# Patient Record
Sex: Male | Born: 1950 | Race: White | Hispanic: No | Marital: Married
Health system: Southern US, Community
[De-identification: ages and names within clinical notes are randomized; demographics above are authoritative.]

---

## 2004-11-28 ENCOUNTER — Ambulatory Visit: Payer: Self-pay | Admitting: General Surgery

## 2011-06-29 ENCOUNTER — Ambulatory Visit: Payer: Self-pay | Admitting: General Surgery

## 2011-08-29 ENCOUNTER — Ambulatory Visit: Payer: Self-pay | Admitting: Family Medicine

## 2011-11-14 ENCOUNTER — Ambulatory Visit: Payer: Self-pay | Admitting: Family Medicine

## 2011-11-15 ENCOUNTER — Ambulatory Visit: Payer: Self-pay | Admitting: Family Medicine

## 2011-11-20 ENCOUNTER — Ambulatory Visit: Payer: Self-pay | Admitting: Family Medicine

## 2011-11-26 ENCOUNTER — Ambulatory Visit: Payer: Self-pay | Admitting: Oncology

## 2011-11-26 LAB — CBC CANCER CENTER
Basophil %: 0.3 %
Eosinophil #: 0 x10 3/mm (ref 0.0–0.7)
Eosinophil %: 0.3 %
HCT: 44.5 % (ref 40.0–52.0)
HGB: 15 g/dL (ref 13.0–18.0)
Lymphocyte #: 1 x10 3/mm (ref 1.0–3.6)
MCH: 30 pg (ref 26.0–34.0)
MCHC: 33.7 g/dL (ref 32.0–36.0)
MCV: 89 fL (ref 80–100)
Monocyte %: 13.5 %
Neutrophil %: 70.2 %
Platelet: 192 x10 3/mm (ref 150–440)
RBC: 4.99 10*6/uL (ref 4.40–5.90)
RDW: 12.7 % (ref 11.5–14.5)
WBC: 6.6 x10 3/mm (ref 3.8–10.6)

## 2011-11-26 LAB — COMPREHENSIVE METABOLIC PANEL
Albumin: 4.3 g/dL (ref 3.4–5.0)
Alkaline Phosphatase: 89 U/L (ref 50–136)
BUN: 11 mg/dL (ref 7–18)
Bilirubin,Total: 0.6 mg/dL (ref 0.2–1.0)
Chloride: 97 mmol/L — ABNORMAL LOW (ref 98–107)
Co2: 27 mmol/L (ref 21–32)
EGFR (African American): >60
EGFR (Non-African Amer.): >60
Glucose: 112 mg/dL — ABNORMAL HIGH (ref 65–99)
Osmolality: 267 (ref 275–301)
Potassium: 4.2 mmol/L (ref 3.5–5.1)
SGOT(AST): 45 U/L — ABNORMAL HIGH (ref 15–37)
Sodium: 133 mmol/L — ABNORMAL LOW (ref 136–145)

## 2011-11-27 LAB — CEA: CEA: 1.9 ng/mL (ref 0.0–4.7)

## 2011-11-27 LAB — CANCER ANTIGEN 19-9: CA 19-9: 792 U/mL — ABNORMAL HIGH (ref 0–35)

## 2011-12-03 ENCOUNTER — Ambulatory Visit: Payer: Self-pay | Admitting: Oncology

## 2011-12-03 LAB — PROTIME-INR
INR: 0.9
Prothrombin Time: 12.7 secs (ref 11.5–14.7)

## 2011-12-03 LAB — APTT: Activated PTT: 27.7 secs (ref 23.6–35.9)

## 2011-12-07 LAB — PATHOLOGY REPORT

## 2011-12-10 ENCOUNTER — Ambulatory Visit: Payer: Self-pay | Admitting: Oncology

## 2011-12-10 LAB — COMPREHENSIVE METABOLIC PANEL
Albumin: 3.8 g/dL (ref 3.4–5.0)
Alkaline Phosphatase: 100 U/L (ref 50–136)
Anion Gap: 9 (ref 7–16)
BUN: 10 mg/dL (ref 7–18)
Calcium, Total: 8.8 mg/dL (ref 8.5–10.1)
Creatinine: 0.83 mg/dL (ref 0.60–1.30)
Glucose: 103 mg/dL — ABNORMAL HIGH (ref 65–99)
Osmolality: 266 (ref 275–301)
Potassium: 3.9 mmol/L (ref 3.5–5.1)
SGOT(AST): 42 U/L — ABNORMAL HIGH (ref 15–37)
Sodium: 133 mmol/L — ABNORMAL LOW (ref 136–145)
Total Protein: 7.1 g/dL (ref 6.4–8.2)

## 2011-12-10 LAB — CBC CANCER CENTER
Basophil #: 0 x10 3/mm (ref 0.0–0.1)
HCT: 39.6 % — ABNORMAL LOW (ref 40.0–52.0)
Lymphocyte #: 0.8 x10 3/mm — ABNORMAL LOW (ref 1.0–3.6)
Lymphocyte %: 16.1 %
MCHC: 34.5 g/dL (ref 32.0–36.0)
MCV: 89 fL (ref 80–100)
Monocyte %: 16.5 %
Platelet: 188 x10 3/mm (ref 150–440)
RBC: 4.48 10*6/uL (ref 4.40–5.90)
RDW: 12.2 % (ref 11.5–14.5)
WBC: 5.3 x10 3/mm (ref 3.8–10.6)

## 2011-12-14 ENCOUNTER — Emergency Department: Payer: Self-pay | Admitting: *Deleted

## 2011-12-14 LAB — CBC
HCT: 37 % — ABNORMAL LOW (ref 40.0–52.0)
HGB: 12.7 g/dL — ABNORMAL LOW (ref 13.0–18.0)
MCH: 30.6 pg (ref 26.0–34.0)
MCHC: 34.3 g/dL (ref 32.0–36.0)
MCV: 89 fL (ref 80–100)
Platelet: 184 10*3/uL (ref 150–440)
RBC: 4.14 10*6/uL — ABNORMAL LOW (ref 4.40–5.90)
RDW: 12 % (ref 11.5–14.5)
WBC: 4.4 10*3/uL (ref 3.8–10.6)

## 2011-12-14 LAB — COMPREHENSIVE METABOLIC PANEL
Albumin: 3.4 g/dL (ref 3.4–5.0)
Alkaline Phosphatase: 98 U/L (ref 50–136)
Anion Gap: 11 (ref 7–16)
BUN: 12 mg/dL (ref 7–18)
Bilirubin,Total: 0.5 mg/dL (ref 0.2–1.0)
Calcium, Total: 8.4 mg/dL — ABNORMAL LOW (ref 8.5–10.1)
Chloride: 99 mmol/L (ref 98–107)
Co2: 22 mmol/L (ref 21–32)
Creatinine: 0.74 mg/dL (ref 0.60–1.30)
EGFR (African American): >60
EGFR (Non-African Amer.): >60
Glucose: 153 mg/dL — ABNORMAL HIGH (ref 65–99)
Osmolality: 267 (ref 275–301)
Potassium: 3.7 mmol/L (ref 3.5–5.1)
SGOT(AST): 55 U/L — ABNORMAL HIGH (ref 15–37)
SGPT (ALT): 56 U/L
Sodium: 132 mmol/L — ABNORMAL LOW (ref 136–145)
Total Protein: 6.9 g/dL (ref 6.4–8.2)

## 2011-12-17 LAB — CBC CANCER CENTER
Basophil #: 0 x10 3/mm (ref 0.0–0.1)
Basophil %: 0.4 %
Eosinophil #: 0.1 x10 3/mm (ref 0.0–0.7)
Eosinophil %: 2 %
HCT: 37.1 % — ABNORMAL LOW (ref 40.0–52.0)
HGB: 12.7 g/dL — ABNORMAL LOW (ref 13.0–18.0)
Lymphocyte #: 0.6 x10 3/mm — ABNORMAL LOW (ref 1.0–3.6)
Lymphocyte %: 18.8 %
MCH: 30.3 pg (ref 26.0–34.0)
MCV: 89 fL (ref 80–100)
Monocyte #: 0.4 x10 3/mm (ref 0.2–1.0)
Neutrophil #: 2 x10 3/mm (ref 1.4–6.5)
RBC: 4.18 10*6/uL — ABNORMAL LOW (ref 4.40–5.90)
RDW: 12.5 % (ref 11.5–14.5)
WBC: 3 x10 3/mm — ABNORMAL LOW (ref 3.8–10.6)

## 2011-12-17 LAB — COMPREHENSIVE METABOLIC PANEL
Albumin: 3.6 g/dL (ref 3.4–5.0)
BUN: 15 mg/dL (ref 7–18)
Bilirubin,Total: 0.6 mg/dL (ref 0.2–1.0)
Chloride: 99 mmol/L (ref 98–107)
Co2: 27 mmol/L (ref 21–32)
Creatinine: 0.87 mg/dL (ref 0.60–1.30)
EGFR (Non-African Amer.): >60
SGPT (ALT): 118 U/L — ABNORMAL HIGH
Total Protein: 7.2 g/dL (ref 6.4–8.2)

## 2011-12-24 LAB — COMPREHENSIVE METABOLIC PANEL
Anion Gap: 11 (ref 7–16)
BUN: 10 mg/dL (ref 7–18)
Bilirubin,Total: 0.3 mg/dL (ref 0.2–1.0)
Chloride: 97 mmol/L — ABNORMAL LOW (ref 98–107)
Co2: 26 mmol/L (ref 21–32)
Creatinine: 0.81 mg/dL (ref 0.60–1.30)
EGFR (African American): >60
EGFR (Non-African Amer.): >60
Osmolality: 268 (ref 275–301)
Potassium: 4.2 mmol/L (ref 3.5–5.1)
SGOT(AST): 48 U/L — ABNORMAL HIGH (ref 15–37)
Total Protein: 6.9 g/dL (ref 6.4–8.2)

## 2011-12-24 LAB — CBC CANCER CENTER
Basophil %: 0.2 %
HCT: 37.2 % — ABNORMAL LOW (ref 40.0–52.0)
HGB: 12.6 g/dL — ABNORMAL LOW (ref 13.0–18.0)
Lymphocyte %: 23.3 %
Monocyte #: 0.5 x10 3/mm (ref 0.2–1.0)
Monocyte %: 16 %
Neutrophil #: 2.1 x10 3/mm (ref 1.4–6.5)
WBC: 3.4 x10 3/mm — ABNORMAL LOW (ref 3.8–10.6)

## 2012-01-07 LAB — COMPREHENSIVE METABOLIC PANEL
Albumin: 3.6 g/dL (ref 3.4–5.0)
Alkaline Phosphatase: 81 U/L (ref 50–136)
Anion Gap: 12 (ref 7–16)
Bilirubin,Total: 0.3 mg/dL (ref 0.2–1.0)
Calcium, Total: 8.7 mg/dL (ref 8.5–10.1)
EGFR (Non-African Amer.): >60
SGOT(AST): 33 U/L (ref 15–37)
Total Protein: 6.7 g/dL (ref 6.4–8.2)

## 2012-01-07 LAB — CBC CANCER CENTER
Basophil #: 0.1 x10 3/mm (ref 0.0–0.1)
Basophil %: 1.1 %
Eosinophil #: 0 x10 3/mm (ref 0.0–0.7)
HCT: 37.6 % — ABNORMAL LOW (ref 40.0–52.0)
HGB: 13 g/dL (ref 13.0–18.0)
Lymphocyte #: 1 x10 3/mm (ref 1.0–3.6)
MCH: 31.2 pg (ref 26.0–34.0)
MCHC: 34.7 g/dL (ref 32.0–36.0)
MCV: 90 fL (ref 80–100)
Monocyte #: 0.9 x10 3/mm (ref 0.2–1.0)
Neutrophil #: 2.6 x10 3/mm (ref 1.4–6.5)
RDW: 13.6 % (ref 11.5–14.5)

## 2012-01-10 ENCOUNTER — Ambulatory Visit: Payer: Self-pay | Admitting: Oncology

## 2012-01-14 LAB — CBC CANCER CENTER
Basophil #: 0 x10 3/mm (ref 0.0–0.1)
Eosinophil %: 0.9 %
HGB: 12.5 g/dL — ABNORMAL LOW (ref 13.0–18.0)
Lymphocyte #: 0.8 x10 3/mm — ABNORMAL LOW (ref 1.0–3.6)
Lymphocyte %: 24.8 %
MCV: 91 fL (ref 80–100)
Monocyte %: 13 %
Platelet: 228 x10 3/mm (ref 150–440)
RBC: 4.03 10*6/uL — ABNORMAL LOW (ref 4.40–5.90)
RDW: 13.3 % (ref 11.5–14.5)
WBC: 3.4 x10 3/mm — ABNORMAL LOW (ref 3.8–10.6)

## 2012-01-14 LAB — COMPREHENSIVE METABOLIC PANEL
Albumin: 3.4 g/dL (ref 3.4–5.0)
Anion Gap: 10 (ref 7–16)
BUN: 16 mg/dL (ref 7–18)
Bilirubin,Total: 0.2 mg/dL (ref 0.2–1.0)
Chloride: 98 mmol/L (ref 98–107)
Co2: 26 mmol/L (ref 21–32)
Creatinine: 0.94 mg/dL (ref 0.60–1.30)
EGFR (African American): >60
Glucose: 136 mg/dL — ABNORMAL HIGH (ref 65–99)
Osmolality: 272 (ref 275–301)
Potassium: 4 mmol/L (ref 3.5–5.1)
SGOT(AST): 32 U/L (ref 15–37)
SGPT (ALT): 55 U/L (ref 12–78)
Sodium: 134 mmol/L — ABNORMAL LOW (ref 136–145)
Total Protein: 6.6 g/dL (ref 6.4–8.2)

## 2012-01-18 ENCOUNTER — Ambulatory Visit: Payer: Self-pay | Admitting: General Surgery

## 2012-01-21 LAB — CBC CANCER CENTER
Basophil #: 0 x10 3/mm (ref 0.0–0.1)
Basophil %: 0.7 %
Eosinophil #: 0 x10 3/mm (ref 0.0–0.7)
Eosinophil %: 1 %
HCT: 36.3 % — ABNORMAL LOW (ref 40.0–52.0)
Lymphocyte #: 0.8 x10 3/mm — ABNORMAL LOW (ref 1.0–3.6)
MCH: 30.9 pg (ref 26.0–34.0)
MCHC: 33.9 g/dL (ref 32.0–36.0)
MCV: 91 fL (ref 80–100)
Monocyte #: 0.5 x10 3/mm (ref 0.2–1.0)
Neutrophil #: 2.1 x10 3/mm (ref 1.4–6.5)
RBC: 3.98 10*6/uL — ABNORMAL LOW (ref 4.40–5.90)
RDW: 13.8 % (ref 11.5–14.5)
WBC: 3.5 x10 3/mm — ABNORMAL LOW (ref 3.8–10.6)

## 2012-01-21 LAB — COMPREHENSIVE METABOLIC PANEL
Albumin: 3.5 g/dL (ref 3.4–5.0)
Alkaline Phosphatase: 78 U/L (ref 50–136)
Anion Gap: 8 (ref 7–16)
BUN: 13 mg/dL (ref 7–18)
Calcium, Total: 8.6 mg/dL (ref 8.5–10.1)
Creatinine: 0.83 mg/dL (ref 0.60–1.30)
EGFR (African American): >60
EGFR (Non-African Amer.): >60
Glucose: 115 mg/dL — ABNORMAL HIGH (ref 65–99)
Potassium: 3.7 mmol/L (ref 3.5–5.1)
SGOT(AST): 36 U/L (ref 15–37)
SGPT (ALT): 69 U/L (ref 12–78)
Total Protein: 6.6 g/dL (ref 6.4–8.2)

## 2012-02-04 LAB — CBC CANCER CENTER
Basophil #: 0 x10 3/mm (ref 0.0–0.1)
Basophil %: 0.8 %
Eosinophil #: 0 x10 3/mm (ref 0.0–0.7)
Eosinophil %: 1 %
HGB: 11.7 g/dL — ABNORMAL LOW (ref 13.0–18.0)
Lymphocyte #: 0.9 x10 3/mm — ABNORMAL LOW (ref 1.0–3.6)
Lymphocyte %: 19.7 %
MCH: 30.5 pg (ref 26.0–34.0)
MCHC: 33.4 g/dL (ref 32.0–36.0)
MCV: 91 fL (ref 80–100)
Monocyte #: 1.1 x10 3/mm — ABNORMAL HIGH (ref 0.2–1.0)
Monocyte %: 24.3 %
Platelet: 272 x10 3/mm (ref 150–440)
RBC: 3.83 10*6/uL — ABNORMAL LOW (ref 4.40–5.90)
RDW: 14.8 % — ABNORMAL HIGH (ref 11.5–14.5)
WBC: 4.7 x10 3/mm (ref 3.8–10.6)

## 2012-02-04 LAB — COMPREHENSIVE METABOLIC PANEL
Bilirubin,Total: 0.3 mg/dL (ref 0.2–1.0)
Chloride: 98 mmol/L (ref 98–107)
Co2: 26 mmol/L (ref 21–32)
Creatinine: 1.06 mg/dL (ref 0.60–1.30)
EGFR (African American): >60
EGFR (Non-African Amer.): >60
Osmolality: 269 (ref 275–301)
Potassium: 4 mmol/L (ref 3.5–5.1)
Sodium: 133 mmol/L — ABNORMAL LOW (ref 136–145)

## 2012-02-10 ENCOUNTER — Ambulatory Visit: Payer: Self-pay | Admitting: Oncology

## 2012-02-13 LAB — COMPREHENSIVE METABOLIC PANEL
Albumin: 3.2 g/dL — ABNORMAL LOW (ref 3.4–5.0)
Alkaline Phosphatase: 78 U/L (ref 50–136)
Bilirubin,Total: 0.3 mg/dL (ref 0.2–1.0)
Co2: 27 mmol/L (ref 21–32)
Creatinine: 0.94 mg/dL (ref 0.60–1.30)
EGFR (Non-African Amer.): >60
Osmolality: 270 (ref 275–301)
Sodium: 134 mmol/L — ABNORMAL LOW (ref 136–145)
Total Protein: 6.5 g/dL (ref 6.4–8.2)

## 2012-02-13 LAB — CBC CANCER CENTER
Basophil #: 0 x10 3/mm (ref 0.0–0.1)
Basophil %: 0.6 %
Eosinophil #: 0 x10 3/mm (ref 0.0–0.7)
HGB: 11.1 g/dL — ABNORMAL LOW (ref 13.0–18.0)
Lymphocyte #: 0.9 x10 3/mm — ABNORMAL LOW (ref 1.0–3.6)
MCH: 30.4 pg (ref 26.0–34.0)
MCHC: 33 g/dL (ref 32.0–36.0)
MCV: 92 fL (ref 80–100)
Monocyte #: 0.9 x10 3/mm (ref 0.2–1.0)
Neutrophil #: 1.9 x10 3/mm (ref 1.4–6.5)
Neutrophil %: 51 %

## 2012-02-20 LAB — CBC CANCER CENTER
Basophil #: 0 x10 3/mm (ref 0.0–0.1)
Eosinophil #: 0 x10 3/mm (ref 0.0–0.7)
Lymphocyte %: 30.8 %
MCHC: 32.8 g/dL (ref 32.0–36.0)
Neutrophil #: 1.1 x10 3/mm — ABNORMAL LOW (ref 1.4–6.5)
Neutrophil %: 46 %
Platelet: 126 x10 3/mm — ABNORMAL LOW (ref 150–440)
RBC: 3.73 10*6/uL — ABNORMAL LOW (ref 4.40–5.90)
RDW: 15.7 % — ABNORMAL HIGH (ref 11.5–14.5)

## 2012-02-20 LAB — COMPREHENSIVE METABOLIC PANEL
Albumin: 3.3 g/dL — ABNORMAL LOW (ref 3.4–5.0)
Alkaline Phosphatase: 76 U/L (ref 50–136)
BUN: 14 mg/dL (ref 7–18)
Bilirubin,Total: 0.3 mg/dL (ref 0.2–1.0)
Chloride: 99 mmol/L (ref 98–107)
Creatinine: 1.05 mg/dL (ref 0.60–1.30)
EGFR (African American): >60
Glucose: 116 mg/dL — ABNORMAL HIGH (ref 65–99)
SGOT(AST): 41 U/L — ABNORMAL HIGH (ref 15–37)
SGPT (ALT): 62 U/L (ref 12–78)
Sodium: 135 mmol/L — ABNORMAL LOW (ref 136–145)
Total Protein: 6.6 g/dL (ref 6.4–8.2)

## 2012-03-03 LAB — CBC CANCER CENTER
Basophil #: 0 x10 3/mm (ref 0.0–0.1)
Basophil %: 0.6 %
Eosinophil #: 0.1 x10 3/mm (ref 0.0–0.7)
HGB: 12.1 g/dL — ABNORMAL LOW (ref 13.0–18.0)
Lymphocyte #: 0.5 x10 3/mm — ABNORMAL LOW (ref 1.0–3.6)
MCHC: 32.9 g/dL (ref 32.0–36.0)
MCV: 94 fL (ref 80–100)
Monocyte #: 0.8 x10 3/mm (ref 0.2–1.0)
Monocyte %: 17.1 %
Neutrophil #: 3.1 x10 3/mm (ref 1.4–6.5)
RBC: 3.94 10*6/uL — ABNORMAL LOW (ref 4.40–5.90)

## 2012-03-03 LAB — COMPREHENSIVE METABOLIC PANEL
Albumin: 3.4 g/dL (ref 3.4–5.0)
Alkaline Phosphatase: 80 U/L (ref 50–136)
Anion Gap: 9 (ref 7–16)
BUN: 11 mg/dL (ref 7–18)
Bilirubin,Total: 0.4 mg/dL (ref 0.2–1.0)
Chloride: 100 mmol/L (ref 98–107)
Creatinine: 0.88 mg/dL (ref 0.60–1.30)
EGFR (African American): >60
Glucose: 152 mg/dL — ABNORMAL HIGH (ref 65–99)
Osmolality: 274 (ref 275–301)
Potassium: 4.2 mmol/L (ref 3.5–5.1)
SGOT(AST): 32 U/L (ref 15–37)
SGPT (ALT): 47 U/L (ref 12–78)
Sodium: 136 mmol/L (ref 136–145)
Total Protein: 6.6 g/dL (ref 6.4–8.2)

## 2012-03-10 LAB — COMPREHENSIVE METABOLIC PANEL
Alkaline Phosphatase: 72 U/L (ref 50–136)
Anion Gap: 12 (ref 7–16)
Bilirubin,Total: 0.2 mg/dL (ref 0.2–1.0)
Calcium, Total: 8.8 mg/dL (ref 8.5–10.1)
Chloride: 101 mmol/L (ref 98–107)
Co2: 24 mmol/L (ref 21–32)
Creatinine: 0.74 mg/dL (ref 0.60–1.30)
EGFR (African American): >60
Osmolality: 275 (ref 275–301)
Potassium: 3.9 mmol/L (ref 3.5–5.1)
SGOT(AST): 39 U/L — ABNORMAL HIGH (ref 15–37)
Sodium: 137 mmol/L (ref 136–145)

## 2012-03-10 LAB — CBC CANCER CENTER
Basophil %: 0.8 %
HCT: 35.8 % — ABNORMAL LOW (ref 40.0–52.0)
HGB: 11.6 g/dL — ABNORMAL LOW (ref 13.0–18.0)
Lymphocyte %: 25.9 %
MCHC: 32.5 g/dL (ref 32.0–36.0)
Monocyte %: 20.3 %
Neutrophil #: 1.7 x10 3/mm (ref 1.4–6.5)
RBC: 3.87 10*6/uL — ABNORMAL LOW (ref 4.40–5.90)
RDW: 15.4 % — ABNORMAL HIGH (ref 11.5–14.5)
WBC: 3.3 x10 3/mm — ABNORMAL LOW (ref 3.8–10.6)

## 2012-03-11 ENCOUNTER — Ambulatory Visit: Payer: Self-pay | Admitting: Oncology

## 2012-03-11 DEATH — deceased

## 2012-03-17 LAB — CBC CANCER CENTER
Basophil #: 0 x10 3/mm (ref 0.0–0.1)
Basophil %: 0.6 %
Eosinophil %: 1.3 %
HCT: 34.6 % — ABNORMAL LOW (ref 40.0–52.0)
HGB: 11.2 g/dL — ABNORMAL LOW (ref 13.0–18.0)
Lymphocyte %: 23.5 %
Monocyte %: 19.5 %
Neutrophil #: 1.7 x10 3/mm (ref 1.4–6.5)
Neutrophil %: 55.1 %
RBC: 3.72 10*6/uL — ABNORMAL LOW (ref 4.40–5.90)
WBC: 3.1 x10 3/mm — ABNORMAL LOW (ref 3.8–10.6)

## 2012-03-17 LAB — COMPREHENSIVE METABOLIC PANEL
Albumin: 3.1 g/dL — ABNORMAL LOW (ref 3.4–5.0)
Alkaline Phosphatase: 72 U/L (ref 50–136)
Anion Gap: 7 (ref 7–16)
Bilirubin,Total: 0.3 mg/dL (ref 0.2–1.0)
Co2: 26 mmol/L (ref 21–32)
Creatinine: 0.77 mg/dL (ref 0.60–1.30)
EGFR (Non-African Amer.): >60
Glucose: 138 mg/dL — ABNORMAL HIGH (ref 65–99)
Osmolality: 273 (ref 275–301)
Sodium: 136 mmol/L (ref 136–145)
Total Protein: 6.2 g/dL — ABNORMAL LOW (ref 6.4–8.2)

## 2012-03-31 LAB — COMPREHENSIVE METABOLIC PANEL
Albumin: 3.2 g/dL — ABNORMAL LOW (ref 3.4–5.0)
Alkaline Phosphatase: 73 U/L (ref 50–136)
Bilirubin,Total: 0.3 mg/dL (ref 0.2–1.0)
Calcium, Total: 8.3 mg/dL — ABNORMAL LOW (ref 8.5–10.1)
Chloride: 100 mmol/L (ref 98–107)
EGFR (Non-African Amer.): >60
Osmolality: 270 (ref 275–301)
Potassium: 4.1 mmol/L (ref 3.5–5.1)
SGPT (ALT): 53 U/L (ref 12–78)
Sodium: 135 mmol/L — ABNORMAL LOW (ref 136–145)
Total Protein: 6.3 g/dL — ABNORMAL LOW (ref 6.4–8.2)

## 2012-03-31 LAB — CBC CANCER CENTER
Basophil %: 0.5 %
Eosinophil %: 0.6 %
HGB: 11.7 g/dL — ABNORMAL LOW (ref 13.0–18.0)
Lymphocyte #: 0.8 x10 3/mm — ABNORMAL LOW (ref 1.0–3.6)
Lymphocyte %: 22.2 %
MCV: 93 fL (ref 80–100)
Monocyte %: 30.4 %
Neutrophil #: 1.6 x10 3/mm (ref 1.4–6.5)
RBC: 3.86 10*6/uL — ABNORMAL LOW (ref 4.40–5.90)
RDW: 16.6 % — ABNORMAL HIGH (ref 11.5–14.5)
WBC: 3.5 x10 3/mm — ABNORMAL LOW (ref 3.8–10.6)

## 2012-04-07 LAB — CBC CANCER CENTER
Basophil #: 0 x10 3/mm (ref 0.0–0.1)
Basophil %: 0.7 %
Eosinophil #: 0 x10 3/mm (ref 0.0–0.7)
Eosinophil %: 0.7 %
HCT: 34.5 % — ABNORMAL LOW (ref 40.0–52.0)
Lymphocyte #: 0.8 x10 3/mm — ABNORMAL LOW (ref 1.0–3.6)
Lymphocyte %: 19.6 %
MCH: 30.3 pg (ref 26.0–34.0)
MCV: 93 fL (ref 80–100)
Monocyte #: 0.8 x10 3/mm (ref 0.2–1.0)
Monocyte %: 18.2 %
Neutrophil #: 2.6 x10 3/mm (ref 1.4–6.5)
RBC: 3.71 10*6/uL — ABNORMAL LOW (ref 4.40–5.90)
RDW: 16.4 % — ABNORMAL HIGH (ref 11.5–14.5)
WBC: 4.2 x10 3/mm (ref 3.8–10.6)

## 2012-04-11 ENCOUNTER — Ambulatory Visit: Payer: Self-pay | Admitting: Oncology

## 2012-04-14 LAB — CBC CANCER CENTER
Basophil %: 1 %
Eosinophil %: 1.1 %
HCT: 36.2 % — ABNORMAL LOW (ref 40.0–52.0)
Lymphocyte %: 34.5 %
MCH: 30 pg (ref 26.0–34.0)
MCHC: 32.2 g/dL (ref 32.0–36.0)
Monocyte #: 0.5 x10 3/mm (ref 0.2–1.0)
Neutrophil #: 0.7 x10 3/mm — ABNORMAL LOW (ref 1.4–6.5)
Neutrophil %: 37.4 %
RBC: 3.88 10*6/uL — ABNORMAL LOW (ref 4.40–5.90)
RDW: 16.8 % — ABNORMAL HIGH (ref 11.5–14.5)
WBC: 2 x10 3/mm — CL (ref 3.8–10.6)

## 2012-04-14 LAB — COMPREHENSIVE METABOLIC PANEL
Alkaline Phosphatase: 70 U/L (ref 50–136)
Anion Gap: 11 (ref 7–16)
BUN: 14 mg/dL (ref 7–18)
Bilirubin,Total: 0.2 mg/dL (ref 0.2–1.0)
Calcium, Total: 8.6 mg/dL (ref 8.5–10.1)
Chloride: 95 mmol/L — ABNORMAL LOW (ref 98–107)
Co2: 24 mmol/L (ref 21–32)
EGFR (African American): >60
Glucose: 101 mg/dL — ABNORMAL HIGH (ref 65–99)
Potassium: 4.1 mmol/L (ref 3.5–5.1)
SGOT(AST): 33 U/L (ref 15–37)
Total Protein: 6.3 g/dL — ABNORMAL LOW (ref 6.4–8.2)

## 2012-04-21 LAB — CBC CANCER CENTER
Basophil #: 0 x10 3/mm (ref 0.0–0.1)
Eosinophil #: 0 x10 3/mm (ref 0.0–0.7)
Eosinophil %: 0.2 %
HCT: 37.9 % — ABNORMAL LOW (ref 40.0–52.0)
Lymphocyte %: 17 %
MCH: 30.3 pg (ref 26.0–34.0)
MCHC: 32.6 g/dL (ref 32.0–36.0)
Monocyte %: 23.1 %
Neutrophil #: 2.9 x10 3/mm (ref 1.4–6.5)
Platelet: 210 x10 3/mm (ref 150–440)
RBC: 4.07 10*6/uL — ABNORMAL LOW (ref 4.40–5.90)

## 2012-04-21 LAB — COMPREHENSIVE METABOLIC PANEL
BUN: 14 mg/dL (ref 7–18)
Bilirubin,Total: 0.2 mg/dL (ref 0.2–1.0)
Chloride: 97 mmol/L — ABNORMAL LOW (ref 98–107)
EGFR (African American): >60
EGFR (Non-African Amer.): >60
Glucose: 110 mg/dL — ABNORMAL HIGH (ref 65–99)
Osmolality: 267 (ref 275–301)
Potassium: 3.8 mmol/L (ref 3.5–5.1)
SGOT(AST): 29 U/L (ref 15–37)
SGPT (ALT): 36 U/L (ref 12–78)
Total Protein: 6.4 g/dL (ref 6.4–8.2)

## 2012-04-28 LAB — COMPREHENSIVE METABOLIC PANEL
Anion Gap: 10 (ref 7–16)
Bilirubin,Total: 0.2 mg/dL (ref 0.2–1.0)
Calcium, Total: 8.4 mg/dL — ABNORMAL LOW (ref 8.5–10.1)
Chloride: 99 mmol/L (ref 98–107)
Co2: 25 mmol/L (ref 21–32)
EGFR (African American): >60
EGFR (Non-African Amer.): >60
Osmolality: 269 (ref 275–301)
Potassium: 3.9 mmol/L (ref 3.5–5.1)
SGOT(AST): 41 U/L — ABNORMAL HIGH (ref 15–37)
SGPT (ALT): 52 U/L (ref 12–78)
Total Protein: 6.3 g/dL — ABNORMAL LOW (ref 6.4–8.2)

## 2012-04-28 LAB — CBC CANCER CENTER
Basophil #: 0 x10 3/mm (ref 0.0–0.1)
Eosinophil #: 0 x10 3/mm (ref 0.0–0.7)
Eosinophil %: 1.1 %
Lymphocyte #: 0.8 x10 3/mm — ABNORMAL LOW (ref 1.0–3.6)
Lymphocyte %: 27.5 %
MCH: 29.8 pg (ref 26.0–34.0)
MCV: 92 fL (ref 80–100)
Monocyte #: 0.6 x10 3/mm (ref 0.2–1.0)
Monocyte %: 20.4 %
Neutrophil %: 50 %
Platelet: 213 x10 3/mm (ref 150–440)
RBC: 3.86 10*6/uL — ABNORMAL LOW (ref 4.40–5.90)
RDW: 16 % — ABNORMAL HIGH (ref 11.5–14.5)

## 2012-04-29 LAB — CANCER ANTIGEN 19-9: CA 19-9: 351 U/mL — ABNORMAL HIGH (ref 0–35)

## 2012-05-11 ENCOUNTER — Ambulatory Visit: Payer: Self-pay | Admitting: Oncology

## 2012-05-12 LAB — CBC CANCER CENTER
Basophil #: 0 x10 3/mm (ref 0.0–0.1)
Basophil %: 0.7 %
Lymphocyte %: 20.8 %
MCH: 30.4 pg (ref 26.0–34.0)
Monocyte %: 26.2 %
Platelet: 222 x10 3/mm (ref 150–440)
RDW: 16.7 % — ABNORMAL HIGH (ref 11.5–14.5)
WBC: 3.9 x10 3/mm (ref 3.8–10.6)

## 2012-05-12 LAB — COMPREHENSIVE METABOLIC PANEL
Albumin: 3.4 g/dL (ref 3.4–5.0)
Alkaline Phosphatase: 74 U/L (ref 50–136)
Anion Gap: 11 (ref 7–16)
BUN: 11 mg/dL (ref 7–18)
Calcium, Total: 8.3 mg/dL — ABNORMAL LOW (ref 8.5–10.1)
EGFR (Non-African Amer.): >60
Glucose: 105 mg/dL — ABNORMAL HIGH (ref 65–99)
Osmolality: 268 (ref 275–301)
Potassium: 3.8 mmol/L (ref 3.5–5.1)
SGOT(AST): 36 U/L (ref 15–37)
SGPT (ALT): 42 U/L (ref 12–78)
Sodium: 134 mmol/L — ABNORMAL LOW (ref 136–145)
Total Protein: 6.4 g/dL (ref 6.4–8.2)

## 2012-05-13 LAB — CANCER ANTIGEN 19-9: CA 19-9: 412 U/mL — ABNORMAL HIGH (ref 0–35)

## 2012-05-19 LAB — CBC CANCER CENTER
Basophil %: 0.9 %
HGB: 12.3 g/dL — ABNORMAL LOW (ref 13.0–18.0)
Lymphocyte %: 28.8 %
MCH: 30.6 pg (ref 26.0–34.0)
MCHC: 34.4 g/dL (ref 32.0–36.0)
Neutrophil #: 1.5 x10 3/mm (ref 1.4–6.5)
Neutrophil %: 46.5 %
Platelet: 228 x10 3/mm (ref 150–440)
RBC: 4.04 10*6/uL — ABNORMAL LOW (ref 4.40–5.90)
RDW: 16.2 % — ABNORMAL HIGH (ref 11.5–14.5)

## 2012-05-19 LAB — COMPREHENSIVE METABOLIC PANEL
Alkaline Phosphatase: 77 U/L (ref 50–136)
Anion Gap: 10 (ref 7–16)
Calcium, Total: 8.8 mg/dL (ref 8.5–10.1)
Chloride: 97 mmol/L — ABNORMAL LOW (ref 98–107)
Co2: 27 mmol/L (ref 21–32)
EGFR (African American): >60
Potassium: 3.8 mmol/L (ref 3.5–5.1)
SGOT(AST): 41 U/L — ABNORMAL HIGH (ref 15–37)
Total Protein: 6.6 g/dL (ref 6.4–8.2)

## 2012-05-26 LAB — CBC CANCER CENTER
Basophil #: 0 x10 3/mm (ref 0.0–0.1)
Eosinophil #: 0 x10 3/mm (ref 0.0–0.7)
Eosinophil %: 0.8 %
MCH: 30.7 pg (ref 26.0–34.0)
MCHC: 34.1 g/dL (ref 32.0–36.0)
MCV: 90 fL (ref 80–100)
Monocyte #: 0.6 x10 3/mm (ref 0.2–1.0)
Neutrophil %: 51.4 %
Platelet: 112 x10 3/mm — ABNORMAL LOW (ref 150–440)
RBC: 3.83 10*6/uL — ABNORMAL LOW (ref 4.40–5.90)
RDW: 16 % — ABNORMAL HIGH (ref 11.5–14.5)

## 2012-05-26 LAB — COMPREHENSIVE METABOLIC PANEL
Albumin: 3.4 g/dL (ref 3.4–5.0)
Alkaline Phosphatase: 68 U/L (ref 50–136)
Anion Gap: 9 (ref 7–16)
BUN: 10 mg/dL (ref 7–18)
Creatinine: 0.76 mg/dL (ref 0.60–1.30)
Osmolality: 266 (ref 275–301)
Potassium: 3.8 mmol/L (ref 3.5–5.1)
Sodium: 133 mmol/L — ABNORMAL LOW (ref 136–145)
Total Protein: 6.3 g/dL — ABNORMAL LOW (ref 6.4–8.2)

## 2012-06-11 ENCOUNTER — Ambulatory Visit: Payer: Self-pay | Admitting: Oncology

## 2012-06-16 LAB — CBC CANCER CENTER
Basophil #: 0 x10 3/mm (ref 0.0–0.1)
Eosinophil #: 0 x10 3/mm (ref 0.0–0.7)
Eosinophil %: 0.3 %
HCT: 38.7 % — ABNORMAL LOW (ref 40.0–52.0)
HGB: 12.9 g/dL — ABNORMAL LOW (ref 13.0–18.0)
MCH: 29.9 pg (ref 26.0–34.0)
MCHC: 33.4 g/dL (ref 32.0–36.0)
Monocyte #: 1 x10 3/mm (ref 0.2–1.0)
Monocyte %: 20.8 %
Neutrophil %: 57.9 %
Platelet: 244 x10 3/mm (ref 150–440)
RBC: 4.33 10*6/uL — ABNORMAL LOW (ref 4.40–5.90)
RDW: 16.3 % — ABNORMAL HIGH (ref 11.5–14.5)
WBC: 4.7 x10 3/mm (ref 3.8–10.6)

## 2012-06-16 LAB — COMPREHENSIVE METABOLIC PANEL
Alkaline Phosphatase: 71 U/L (ref 50–136)
BUN: 16 mg/dL (ref 7–18)
Chloride: 100 mmol/L (ref 98–107)
Co2: 25 mmol/L (ref 21–32)
Creatinine: 0.83 mg/dL (ref 0.60–1.30)
EGFR (African American): >60
EGFR (Non-African Amer.): >60
Osmolality: 272 (ref 275–301)
SGOT(AST): 40 U/L — ABNORMAL HIGH (ref 15–37)
SGPT (ALT): 43 U/L (ref 12–78)
Sodium: 135 mmol/L — ABNORMAL LOW (ref 136–145)

## 2012-06-23 LAB — CBC CANCER CENTER
Basophil #: 0 x10 3/mm (ref 0.0–0.1)
Basophil %: 0.4 %
HCT: 35.8 % — ABNORMAL LOW (ref 40.0–52.0)
HGB: 12.2 g/dL — ABNORMAL LOW (ref 13.0–18.0)
Lymphocyte #: 0.8 x10 3/mm — ABNORMAL LOW (ref 1.0–3.6)
MCHC: 34.1 g/dL (ref 32.0–36.0)
MCV: 89 fL (ref 80–100)
Monocyte #: 0.8 x10 3/mm (ref 0.2–1.0)
Neutrophil #: 3 x10 3/mm (ref 1.4–6.5)
Neutrophil %: 64.5 %
Platelet: 149 x10 3/mm — ABNORMAL LOW (ref 150–440)
RDW: 15.6 % — ABNORMAL HIGH (ref 11.5–14.5)
WBC: 4.6 x10 3/mm (ref 3.8–10.6)

## 2012-06-23 LAB — COMPREHENSIVE METABOLIC PANEL
Albumin: 3.2 g/dL — ABNORMAL LOW (ref 3.4–5.0)
Anion Gap: 9 (ref 7–16)
Bilirubin,Total: 0.3 mg/dL (ref 0.2–1.0)
Chloride: 95 mmol/L — ABNORMAL LOW (ref 98–107)
Co2: 26 mmol/L (ref 21–32)
EGFR (African American): >60
EGFR (Non-African Amer.): >60
Glucose: 129 mg/dL — ABNORMAL HIGH (ref 65–99)
Osmolality: 262 (ref 275–301)
Potassium: 4 mmol/L (ref 3.5–5.1)
SGPT (ALT): 39 U/L (ref 12–78)
Sodium: 130 mmol/L — ABNORMAL LOW (ref 136–145)
Total Protein: 6.6 g/dL (ref 6.4–8.2)

## 2012-06-30 LAB — CBC CANCER CENTER
Basophil %: 0.5 %
HCT: 35.7 % — ABNORMAL LOW (ref 40.0–52.0)
MCHC: 34.1 g/dL (ref 32.0–36.0)
MCV: 88 fL (ref 80–100)
Monocyte #: 0.5 x10 3/mm (ref 0.2–1.0)
Monocyte %: 17.3 %
Platelet: 129 x10 3/mm — ABNORMAL LOW (ref 150–440)
RDW: 15.8 % — ABNORMAL HIGH (ref 11.5–14.5)
WBC: 2.9 x10 3/mm — ABNORMAL LOW (ref 3.8–10.6)

## 2012-06-30 LAB — COMPREHENSIVE METABOLIC PANEL
Alkaline Phosphatase: 75 U/L (ref 50–136)
Anion Gap: 7 (ref 7–16)
BUN: 15 mg/dL (ref 7–18)
Bilirubin,Total: 0.2 mg/dL (ref 0.2–1.0)
Chloride: 99 mmol/L (ref 98–107)
Creatinine: 0.68 mg/dL (ref 0.60–1.30)
EGFR (African American): >60
EGFR (Non-African Amer.): >60
Osmolality: 271 (ref 275–301)
SGPT (ALT): 62 U/L (ref 12–78)

## 2012-07-07 LAB — COMPREHENSIVE METABOLIC PANEL
Albumin: 3.5 g/dL (ref 3.4–5.0)
Anion Gap: 8 (ref 7–16)
BUN: 14 mg/dL (ref 7–18)
Bilirubin,Total: 0.4 mg/dL (ref 0.2–1.0)
Calcium, Total: 8.4 mg/dL — ABNORMAL LOW (ref 8.5–10.1)
Chloride: 99 mmol/L (ref 98–107)
Co2: 29 mmol/L (ref 21–32)
Creatinine: 0.83 mg/dL (ref 0.60–1.30)
EGFR (Non-African Amer.): >60
Glucose: 112 mg/dL — ABNORMAL HIGH (ref 65–99)
Osmolality: 273 (ref 275–301)
Potassium: 3.9 mmol/L (ref 3.5–5.1)
Total Protein: 6.7 g/dL (ref 6.4–8.2)

## 2012-07-07 LAB — CBC CANCER CENTER
Basophil #: 0 x10 3/mm (ref 0.0–0.1)
Basophil %: 1 %
Eosinophil #: 0 x10 3/mm (ref 0.0–0.7)
HGB: 13.1 g/dL (ref 13.0–18.0)
MCH: 29.9 pg (ref 26.0–34.0)
MCV: 89 fL (ref 80–100)
Monocyte #: 0.8 x10 3/mm (ref 0.2–1.0)
Neutrophil %: 57.7 %
Platelet: 232 x10 3/mm (ref 150–440)
WBC: 4.4 x10 3/mm (ref 3.8–10.6)

## 2012-07-12 ENCOUNTER — Ambulatory Visit: Payer: Self-pay | Admitting: Oncology

## 2012-07-23 LAB — COMPREHENSIVE METABOLIC PANEL
Albumin: 3.3 g/dL — ABNORMAL LOW (ref 3.4–5.0)
Alkaline Phosphatase: 79 U/L (ref 50–136)
Anion Gap: 9 (ref 7–16)
BUN: 13 mg/dL (ref 7–18)
Bilirubin,Total: 0.4 mg/dL (ref 0.2–1.0)
Calcium, Total: 8.2 mg/dL — ABNORMAL LOW (ref 8.5–10.1)
Chloride: 100 mmol/L (ref 98–107)
Co2: 26 mmol/L (ref 21–32)
Creatinine: 0.92 mg/dL (ref 0.60–1.30)
EGFR (African American): >60
EGFR (Non-African Amer.): >60
Glucose: 179 mg/dL — ABNORMAL HIGH (ref 65–99)
Osmolality: 275 (ref 275–301)
Potassium: 4.2 mmol/L (ref 3.5–5.1)
SGOT(AST): 42 U/L — ABNORMAL HIGH (ref 15–37)
SGPT (ALT): 33 U/L (ref 12–78)
Sodium: 135 mmol/L — ABNORMAL LOW (ref 136–145)
Total Protein: 6.6 g/dL (ref 6.4–8.2)

## 2012-07-23 LAB — CBC CANCER CENTER
Basophil #: 0 x10 3/mm (ref 0.0–0.1)
Basophil %: 0.9 %
Eosinophil #: 0 x10 3/mm (ref 0.0–0.7)
Eosinophil %: 0.4 %
HCT: 37.4 % — ABNORMAL LOW (ref 40.0–52.0)
HGB: 12.6 g/dL — ABNORMAL LOW (ref 13.0–18.0)
Lymphocyte #: 0.6 x10 3/mm — ABNORMAL LOW (ref 1.0–3.6)
Lymphocyte %: 20.3 %
MCH: 29.7 pg (ref 26.0–34.0)
MCHC: 33.8 g/dL (ref 32.0–36.0)
MCV: 88 fL (ref 80–100)
Monocyte #: 0.7 x10 3/mm (ref 0.2–1.0)
Monocyte %: 22.5 %
Neutrophil #: 1.7 x10 3/mm (ref 1.4–6.5)
Neutrophil %: 55.9 %
Platelet: 141 x10 3/mm — ABNORMAL LOW (ref 150–440)
RBC: 4.25 10*6/uL — ABNORMAL LOW (ref 4.40–5.90)
RDW: 15.5 % — ABNORMAL HIGH (ref 11.5–14.5)
WBC: 3 x10 3/mm — ABNORMAL LOW (ref 3.8–10.6)

## 2012-08-06 LAB — COMPREHENSIVE METABOLIC PANEL
Albumin: 3.3 g/dL — ABNORMAL LOW (ref 3.4–5.0)
Anion Gap: 9 (ref 7–16)
BUN: 13 mg/dL (ref 7–18)
Bilirubin,Total: 0.5 mg/dL (ref 0.2–1.0)
Calcium, Total: 8.4 mg/dL — ABNORMAL LOW (ref 8.5–10.1)
Chloride: 101 mmol/L (ref 98–107)
Creatinine: 0.89 mg/dL (ref 0.60–1.30)
EGFR (African American): >60
Potassium: 4.1 mmol/L (ref 3.5–5.1)
SGPT (ALT): 35 U/L (ref 12–78)

## 2012-08-06 LAB — CBC CANCER CENTER
Basophil #: 0 x10 3/mm (ref 0.0–0.1)
Basophil %: 0.9 %
Eosinophil %: 0.5 %
HCT: 37.7 % — ABNORMAL LOW (ref 40.0–52.0)
Lymphocyte #: 0.6 x10 3/mm — ABNORMAL LOW (ref 1.0–3.6)
Lymphocyte %: 23.4 %
Monocyte #: 0.7 x10 3/mm (ref 0.2–1.0)
Monocyte %: 25.1 %
Neutrophil %: 50.1 %
Platelet: 117 x10 3/mm — ABNORMAL LOW (ref 150–440)
RBC: 4.32 10*6/uL — ABNORMAL LOW (ref 4.40–5.90)
WBC: 2.7 x10 3/mm — ABNORMAL LOW (ref 3.8–10.6)

## 2012-08-09 ENCOUNTER — Ambulatory Visit: Payer: Self-pay | Admitting: Oncology

## 2012-08-12 LAB — CBC CANCER CENTER
Basophil %: 0.5 %
Eosinophil #: 0 x10 3/mm (ref 0.0–0.7)
Eosinophil %: 1.4 %
HCT: 38 % — ABNORMAL LOW (ref 40.0–52.0)
HGB: 12.7 g/dL — ABNORMAL LOW (ref 13.0–18.0)
Lymphocyte #: 0.6 x10 3/mm — ABNORMAL LOW (ref 1.0–3.6)
Lymphocyte %: 25.7 %
MCH: 29.2 pg (ref 26.0–34.0)
MCV: 87 fL (ref 80–100)
Monocyte #: 0.4 x10 3/mm (ref 0.2–1.0)
Monocyte %: 16.7 %
Neutrophil #: 1.3 x10 3/mm — ABNORMAL LOW (ref 1.4–6.5)
Neutrophil %: 55.7 %
Platelet: 141 x10 3/mm — ABNORMAL LOW (ref 150–440)
RDW: 15.4 % — ABNORMAL HIGH (ref 11.5–14.5)

## 2012-08-12 LAB — COMPREHENSIVE METABOLIC PANEL
BUN: 10 mg/dL (ref 7–18)
Calcium, Total: 7.7 mg/dL — ABNORMAL LOW (ref 8.5–10.1)
Co2: 25 mmol/L (ref 21–32)
Creatinine: 0.8 mg/dL (ref 0.60–1.30)
EGFR (African American): >60
Glucose: 107 mg/dL — ABNORMAL HIGH (ref 65–99)
Potassium: 4 mmol/L (ref 3.5–5.1)
SGOT(AST): 30 U/L (ref 15–37)
SGPT (ALT): 37 U/L (ref 12–78)
Total Protein: 6.4 g/dL (ref 6.4–8.2)

## 2012-08-14 LAB — CBC CANCER CENTER
Basophil #: 0 x10 3/mm (ref 0.0–0.1)
Basophil %: 0.6 %
Eosinophil #: 0 x10 3/mm (ref 0.0–0.7)
Lymphocyte #: 0.9 x10 3/mm — ABNORMAL LOW (ref 1.0–3.6)
Lymphocyte %: 28.7 %
MCH: 29.4 pg (ref 26.0–34.0)
MCHC: 33.7 g/dL (ref 32.0–36.0)
MCV: 87 fL (ref 80–100)
Monocyte %: 22.3 %
Neutrophil #: 1.4 x10 3/mm (ref 1.4–6.5)
Platelet: 132 x10 3/mm — ABNORMAL LOW (ref 150–440)
RBC: 4.16 10*6/uL — ABNORMAL LOW (ref 4.40–5.90)
WBC: 3 x10 3/mm — ABNORMAL LOW (ref 3.8–10.6)

## 2012-08-14 LAB — BASIC METABOLIC PANEL
BUN: 8 mg/dL (ref 7–18)
Chloride: 101 mmol/L (ref 98–107)
Co2: 24 mmol/L (ref 21–32)
Creatinine: 0.95 mg/dL (ref 0.60–1.30)
EGFR (African American): >60
EGFR (Non-African Amer.): >60
Potassium: 3.4 mmol/L — ABNORMAL LOW (ref 3.5–5.1)
Sodium: 137 mmol/L (ref 136–145)

## 2012-08-20 LAB — COMPREHENSIVE METABOLIC PANEL
Albumin: 3 g/dL — ABNORMAL LOW (ref 3.4–5.0)
Anion Gap: 9 (ref 7–16)
BUN: 9 mg/dL (ref 7–18)
Chloride: 103 mmol/L (ref 98–107)
Creatinine: 0.98 mg/dL (ref 0.60–1.30)
EGFR (African American): >60
EGFR (Non-African Amer.): >60
Glucose: 140 mg/dL — ABNORMAL HIGH (ref 65–99)
Osmolality: 280 (ref 275–301)
Potassium: 3.7 mmol/L (ref 3.5–5.1)
SGPT (ALT): 102 U/L — ABNORMAL HIGH (ref 12–78)
Sodium: 140 mmol/L (ref 136–145)

## 2012-08-20 LAB — CBC CANCER CENTER
Basophil #: 0 x10 3/mm (ref 0.0–0.1)
Eosinophil #: 0 x10 3/mm (ref 0.0–0.7)
Eosinophil %: 1.2 %
Lymphocyte %: 25.3 %
MCH: 28.8 pg (ref 26.0–34.0)
Neutrophil #: 1 x10 3/mm — ABNORMAL LOW (ref 1.4–6.5)
Platelet: 107 x10 3/mm — ABNORMAL LOW (ref 150–440)
RDW: 16.1 % — ABNORMAL HIGH (ref 11.5–14.5)
WBC: 2 x10 3/mm — CL (ref 3.8–10.6)

## 2012-08-21 LAB — CANCER ANTIGEN 19-9: CA 19-9: 660 U/mL — ABNORMAL HIGH (ref 0–35)

## 2012-08-27 LAB — COMPREHENSIVE METABOLIC PANEL
Alkaline Phosphatase: 75 U/L (ref 50–136)
Anion Gap: 6 — ABNORMAL LOW (ref 7–16)
BUN: 11 mg/dL (ref 7–18)
Bilirubin,Total: 0.5 mg/dL (ref 0.2–1.0)
Calcium, Total: 8.3 mg/dL — ABNORMAL LOW (ref 8.5–10.1)
Co2: 30 mmol/L (ref 21–32)
Creatinine: 1 mg/dL (ref 0.60–1.30)
EGFR (African American): >60
EGFR (Non-African Amer.): >60
Osmolality: 271 (ref 275–301)
Potassium: 4.4 mmol/L (ref 3.5–5.1)
SGPT (ALT): 51 U/L (ref 12–78)
Sodium: 134 mmol/L — ABNORMAL LOW (ref 136–145)
Total Protein: 6.9 g/dL (ref 6.4–8.2)

## 2012-08-27 LAB — CBC CANCER CENTER
Basophil %: 0.8 %
Eosinophil #: 0 x10 3/mm (ref 0.0–0.7)
Eosinophil %: 0.3 %
HCT: 37.7 % — ABNORMAL LOW (ref 40.0–52.0)
HGB: 12.5 g/dL — ABNORMAL LOW (ref 13.0–18.0)
Lymphocyte #: 0.7 x10 3/mm — ABNORMAL LOW (ref 1.0–3.6)
MCH: 29.2 pg (ref 26.0–34.0)
MCHC: 33.3 g/dL (ref 32.0–36.0)
Monocyte #: 1.2 x10 3/mm — ABNORMAL HIGH (ref 0.2–1.0)
Monocyte %: 31.3 %
Neutrophil #: 1.8 x10 3/mm (ref 1.4–6.5)
Neutrophil %: 49.5 %
Platelet: 147 x10 3/mm — ABNORMAL LOW (ref 150–440)
RBC: 4.29 10*6/uL — ABNORMAL LOW (ref 4.40–5.90)
RDW: 16.3 % — ABNORMAL HIGH (ref 11.5–14.5)

## 2012-09-09 ENCOUNTER — Ambulatory Visit: Payer: Self-pay | Admitting: Oncology

## 2012-09-15 LAB — CBC CANCER CENTER
Basophil #: 0 x10 3/mm (ref 0.0–0.1)
Eosinophil #: 0 x10 3/mm (ref 0.0–0.7)
HGB: 11.7 g/dL — ABNORMAL LOW (ref 13.0–18.0)
Lymphocyte #: 0.7 x10 3/mm — ABNORMAL LOW (ref 1.0–3.6)
MCH: 28.1 pg (ref 26.0–34.0)
MCHC: 33 g/dL (ref 32.0–36.0)
Monocyte #: 0.6 x10 3/mm (ref 0.2–1.0)
Neutrophil #: 1.4 x10 3/mm (ref 1.4–6.5)
Platelet: 192 x10 3/mm (ref 150–440)
RBC: 4.16 10*6/uL — ABNORMAL LOW (ref 4.40–5.90)

## 2012-09-15 LAB — COMPREHENSIVE METABOLIC PANEL
Albumin: 3.1 g/dL — ABNORMAL LOW (ref 3.4–5.0)
BUN: 13 mg/dL (ref 7–18)
Bilirubin,Total: 0.4 mg/dL (ref 0.2–1.0)
Calcium, Total: 8.3 mg/dL — ABNORMAL LOW (ref 8.5–10.1)
Chloride: 99 mmol/L (ref 98–107)
Co2: 26 mmol/L (ref 21–32)
Creatinine: 0.89 mg/dL (ref 0.60–1.30)
EGFR (African American): >60
EGFR (Non-African Amer.): >60
Glucose: 197 mg/dL — ABNORMAL HIGH (ref 65–99)
Osmolality: 272 (ref 275–301)
Potassium: 4.1 mmol/L (ref 3.5–5.1)
SGPT (ALT): 29 U/L (ref 12–78)
Sodium: 133 mmol/L — ABNORMAL LOW (ref 136–145)
Total Protein: 6.8 g/dL (ref 6.4–8.2)

## 2012-09-16 LAB — CANCER ANTIGEN 19-9: CA 19-9: 752 U/mL — ABNORMAL HIGH (ref 0–35)

## 2012-09-29 LAB — CBC CANCER CENTER
Basophil #: 0 x10 3/mm (ref 0.0–0.1)
Basophil %: 0.6 %
Eosinophil %: 0.1 %
HGB: 12.4 g/dL — ABNORMAL LOW (ref 13.0–18.0)
Lymphocyte #: 0.8 x10 3/mm — ABNORMAL LOW (ref 1.0–3.6)
MCH: 28 pg (ref 26.0–34.0)
MCV: 85 fL (ref 80–100)
Monocyte #: 0.7 x10 3/mm (ref 0.2–1.0)
Monocyte %: 17.2 %
Neutrophil #: 2.6 x10 3/mm (ref 1.4–6.5)
RBC: 4.44 10*6/uL (ref 4.40–5.90)
WBC: 4.1 x10 3/mm (ref 3.8–10.6)

## 2012-09-30 LAB — CANCER ANTIGEN 19-9: CA 19-9: 1109 U/mL — ABNORMAL HIGH (ref 0–35)

## 2012-10-06 LAB — COMPREHENSIVE METABOLIC PANEL WITH GFR
Albumin: 3.3 g/dL — ABNORMAL LOW (ref 3.4–5.0)
Alkaline Phosphatase: 165 U/L — ABNORMAL HIGH (ref 50–136)
Anion Gap: 13 (ref 7–16)
BUN: 8 mg/dL (ref 7–18)
Bilirubin,Total: 0.5 mg/dL (ref 0.2–1.0)
Calcium, Total: 9.2 mg/dL (ref 8.5–10.1)
Chloride: 93 mmol/L — ABNORMAL LOW (ref 98–107)
Co2: 27 mmol/L (ref 21–32)
Creatinine: 0.99 mg/dL (ref 0.60–1.30)
EGFR (African American): >60
EGFR (Non-African Amer.): >60
Glucose: 134 mg/dL — ABNORMAL HIGH (ref 65–99)
Osmolality: 267 (ref 275–301)
Potassium: 4.2 mmol/L (ref 3.5–5.1)
SGOT(AST): 85 U/L — ABNORMAL HIGH (ref 15–37)
SGPT (ALT): 36 U/L (ref 12–78)
Sodium: 133 mmol/L — ABNORMAL LOW (ref 136–145)
Total Protein: 7.1 g/dL (ref 6.4–8.2)

## 2012-10-06 LAB — CBC CANCER CENTER
Basophil #: 0 x10 3/mm (ref 0.0–0.1)
Basophil %: 0.6 %
Eosinophil #: 0 x10 3/mm (ref 0.0–0.7)
Eosinophil %: 0.2 %
Lymphocyte #: 0.9 x10 3/mm — ABNORMAL LOW (ref 1.0–3.6)
Lymphocyte %: 21.5 %
MCH: 27.9 pg (ref 26.0–34.0)
MCV: 85 fL (ref 80–100)
Monocyte %: 22.7 %
Neutrophil #: 2.2 x10 3/mm (ref 1.4–6.5)
Neutrophil %: 55 %
RBC: 4.38 10*6/uL — ABNORMAL LOW (ref 4.40–5.90)
RDW: 15.8 % — ABNORMAL HIGH (ref 11.5–14.5)
WBC: 4.1 x10 3/mm (ref 3.8–10.6)

## 2012-10-09 ENCOUNTER — Ambulatory Visit: Payer: Self-pay | Admitting: Oncology

## 2012-10-29 LAB — COMPREHENSIVE METABOLIC PANEL
Albumin: 3.4 g/dL (ref 3.4–5.0)
Alkaline Phosphatase: 174 U/L — ABNORMAL HIGH (ref 50–136)
Anion Gap: 12 (ref 7–16)
BUN: 11 mg/dL (ref 7–18)
Bilirubin,Total: 0.4 mg/dL (ref 0.2–1.0)
Chloride: 97 mmol/L — ABNORMAL LOW (ref 98–107)
Co2: 25 mmol/L (ref 21–32)
EGFR (African American): >60
Glucose: 184 mg/dL — ABNORMAL HIGH (ref 65–99)
Potassium: 4.1 mmol/L (ref 3.5–5.1)
SGOT(AST): 88 U/L — ABNORMAL HIGH (ref 15–37)
SGPT (ALT): 33 U/L (ref 12–78)
Sodium: 134 mmol/L — ABNORMAL LOW (ref 136–145)
Total Protein: 7.1 g/dL (ref 6.4–8.2)

## 2012-10-29 LAB — CBC CANCER CENTER
Basophil #: 0 x10 3/mm (ref 0.0–0.1)
Basophil %: 0.7 %
Eosinophil #: 0 x10 3/mm (ref 0.0–0.7)
HCT: 37.3 % — ABNORMAL LOW (ref 40.0–52.0)
HGB: 12.4 g/dL — ABNORMAL LOW (ref 13.0–18.0)
Lymphocyte #: 0.9 x10 3/mm — ABNORMAL LOW (ref 1.0–3.6)
Lymphocyte %: 19.9 %
MCHC: 33.4 g/dL (ref 32.0–36.0)
MCV: 84 fL (ref 80–100)
Monocyte #: 1 x10 3/mm (ref 0.2–1.0)
Neutrophil %: 57.5 %
RBC: 4.42 10*6/uL (ref 4.40–5.90)
WBC: 4.5 x10 3/mm (ref 3.8–10.6)

## 2012-11-09 ENCOUNTER — Ambulatory Visit: Payer: Self-pay | Admitting: Oncology

## 2012-11-18 IMAGING — CR DG CHEST 1V PORT
1 series · 1 of 1 positions shown · non-contrast
Comparison: none

REASON FOR EXAM: port placement
COMMENTS:

PROCEDURE:     DXR - DXR PORTABLE CHEST SINGLE VIEW  - January 18, 2012  [DATE]
RESULT:     Frontal view of the chest is performed.

[portable]
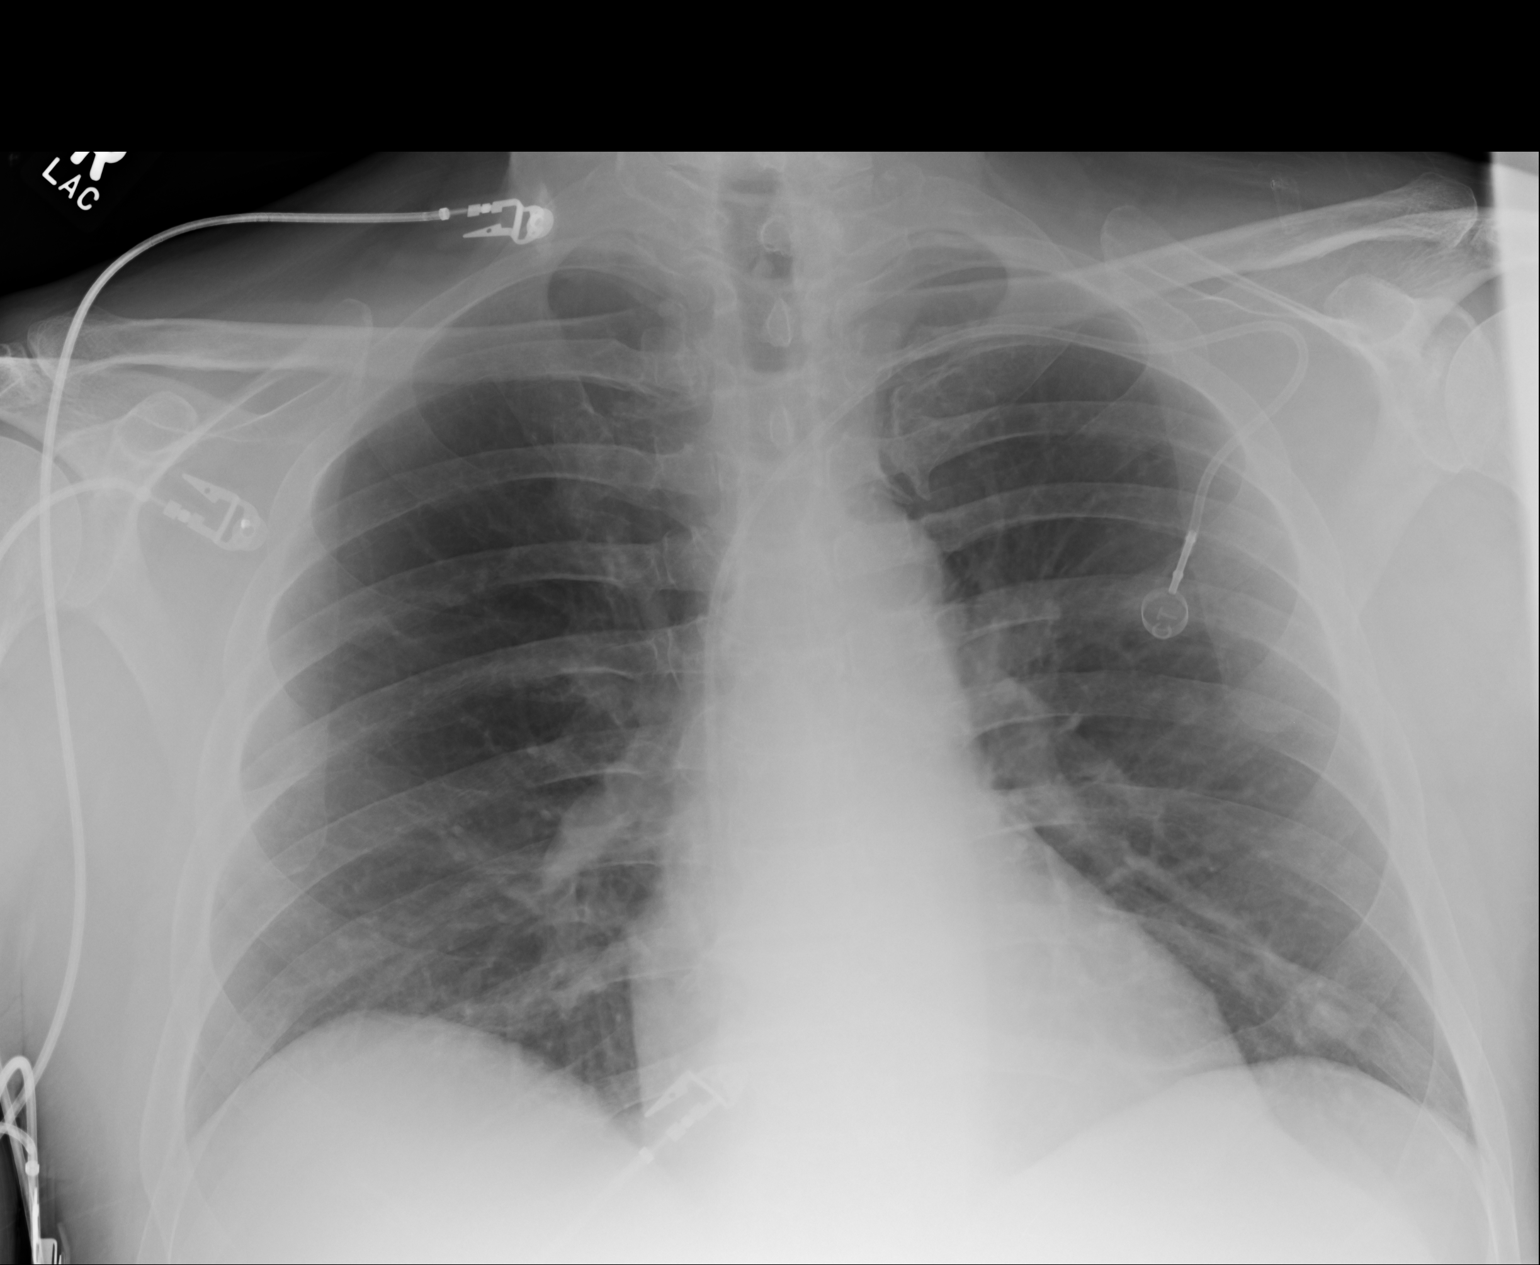

[1 of 1 positions shown; findings below may reference images not displayed]

FINDINGS: The patient is status post left side Port-A-Cath placement. The
Port-A-Cath tip projects in the region of the superior vena cava/right
atrial junction. There is no evidence of pneumothorax. There is no evidence
of focal infiltrates, effusions or edema. Nodular density projects in the
region of the left lung base possibly representing a nipple shadow. The
cardiac silhouette and visualized bony skeleton is unremarkable.
IMPRESSION: The patient is status post left side Port-A-Cath placement
as described above.

## 2012-11-19 LAB — COMPREHENSIVE METABOLIC PANEL
BUN: 13 mg/dL (ref 7–18)
Bilirubin,Total: 0.4 mg/dL (ref 0.2–1.0)
Calcium, Total: 9 mg/dL (ref 8.5–10.1)
Chloride: 96 mmol/L — ABNORMAL LOW (ref 98–107)
Co2: 28 mmol/L (ref 21–32)
EGFR (African American): >60
Glucose: 180 mg/dL — ABNORMAL HIGH (ref 65–99)
Osmolality: 269 (ref 275–301)
SGOT(AST): 68 U/L — ABNORMAL HIGH (ref 15–37)
SGPT (ALT): 34 U/L (ref 12–78)
Sodium: 132 mmol/L — ABNORMAL LOW (ref 136–145)
Total Protein: 7.2 g/dL (ref 6.4–8.2)

## 2012-11-19 LAB — CBC CANCER CENTER
Basophil #: 0 x10 3/mm (ref 0.0–0.1)
Basophil %: 0.6 %
Eosinophil #: 0 x10 3/mm (ref 0.0–0.7)
Eosinophil %: 0.3 %
HCT: 34.2 % — ABNORMAL LOW (ref 40.0–52.0)
HGB: 11.6 g/dL — ABNORMAL LOW (ref 13.0–18.0)
Lymphocyte #: 0.9 x10 3/mm — ABNORMAL LOW (ref 1.0–3.6)
Lymphocyte %: 25.6 %
MCH: 27.7 pg (ref 26.0–34.0)
MCHC: 33.8 g/dL (ref 32.0–36.0)
MCV: 82 fL (ref 80–100)
Monocyte %: 20.1 %
RBC: 4.18 10*6/uL — ABNORMAL LOW (ref 4.40–5.90)
WBC: 3.4 x10 3/mm — ABNORMAL LOW (ref 3.8–10.6)

## 2012-12-09 ENCOUNTER — Ambulatory Visit: Payer: Self-pay | Admitting: Oncology

## 2012-12-17 LAB — COMPREHENSIVE METABOLIC PANEL
Albumin: 3.6 g/dL (ref 3.4–5.0)
Anion Gap: 9 (ref 7–16)
BUN: 10 mg/dL (ref 7–18)
Chloride: 99 mmol/L (ref 98–107)
Creatinine: 0.99 mg/dL (ref 0.60–1.30)
EGFR (African American): >60
Total Protein: 7.2 g/dL (ref 6.4–8.2)

## 2012-12-17 LAB — CBC CANCER CENTER
Basophil #: 0 x10 3/mm (ref 0.0–0.1)
Basophil %: 0.6 %
Eosinophil %: 0.3 %
HCT: 35.8 % — ABNORMAL LOW (ref 40.0–52.0)
HGB: 12.1 g/dL — ABNORMAL LOW (ref 13.0–18.0)
Lymphocyte #: 0.9 x10 3/mm — ABNORMAL LOW (ref 1.0–3.6)
MCH: 28.2 pg (ref 26.0–34.0)
MCV: 83 fL (ref 80–100)
Monocyte #: 0.7 x10 3/mm (ref 0.2–1.0)
Monocyte %: 14.6 %
Platelet: 171 x10 3/mm (ref 150–440)
RBC: 4.29 10*6/uL — ABNORMAL LOW (ref 4.40–5.90)
RDW: 16.6 % — ABNORMAL HIGH (ref 11.5–14.5)
WBC: 4.8 x10 3/mm (ref 3.8–10.6)

## 2012-12-18 LAB — CANCER ANTIGEN 19-9: CA 19-9: 1798 U/mL — ABNORMAL HIGH (ref 0–35)

## 2013-01-07 LAB — COMPREHENSIVE METABOLIC PANEL
Albumin: 3.3 g/dL — ABNORMAL LOW (ref 3.4–5.0)
Bilirubin,Total: 0.4 mg/dL (ref 0.2–1.0)
Calcium, Total: 9.2 mg/dL (ref 8.5–10.1)
EGFR (African American): >60
Glucose: 146 mg/dL — ABNORMAL HIGH (ref 65–99)
Osmolality: 276 (ref 275–301)
Potassium: 4.2 mmol/L (ref 3.5–5.1)
SGOT(AST): 79 U/L — ABNORMAL HIGH (ref 15–37)
SGPT (ALT): 36 U/L (ref 12–78)
Sodium: 137 mmol/L (ref 136–145)

## 2013-01-07 LAB — CBC CANCER CENTER
Basophil #: 0 x10 3/mm (ref 0.0–0.1)
Basophil %: 0.9 %
Eosinophil #: 0 x10 3/mm (ref 0.0–0.7)
HCT: 33.2 % — ABNORMAL LOW (ref 40.0–52.0)
HGB: 11 g/dL — ABNORMAL LOW (ref 13.0–18.0)
Lymphocyte #: 0.7 x10 3/mm — ABNORMAL LOW (ref 1.0–3.6)
Lymphocyte %: 24.6 %
Monocyte #: 0.6 x10 3/mm (ref 0.2–1.0)
Monocyte %: 21.8 %
Neutrophil #: 1.5 x10 3/mm (ref 1.4–6.5)
Neutrophil %: 52.3 %
RDW: 15.4 % — ABNORMAL HIGH (ref 11.5–14.5)
WBC: 2.8 x10 3/mm — ABNORMAL LOW (ref 3.8–10.6)

## 2013-01-09 ENCOUNTER — Ambulatory Visit: Payer: Self-pay | Admitting: Oncology

## 2013-01-28 LAB — CBC CANCER CENTER
Basophil %: 1.1 %
Eosinophil #: 0 x10 3/mm (ref 0.0–0.7)
Eosinophil %: 0.7 %
HCT: 35.2 % — ABNORMAL LOW (ref 40.0–52.0)
HGB: 11.7 g/dL — ABNORMAL LOW (ref 13.0–18.0)
Lymphocyte #: 0.7 x10 3/mm — ABNORMAL LOW (ref 1.0–3.6)
Lymphocyte %: 29.5 %
Monocyte #: 0.5 x10 3/mm (ref 0.2–1.0)
Monocyte %: 21.5 %
Neutrophil #: 1.2 x10 3/mm — ABNORMAL LOW (ref 1.4–6.5)
Platelet: 156 x10 3/mm (ref 150–440)
RBC: 4.18 10*6/uL — ABNORMAL LOW (ref 4.40–5.90)
RDW: 16.8 % — ABNORMAL HIGH (ref 11.5–14.5)
WBC: 2.5 x10 3/mm — ABNORMAL LOW (ref 3.8–10.6)

## 2013-01-28 LAB — COMPREHENSIVE METABOLIC PANEL
Anion Gap: 9 (ref 7–16)
Bilirubin,Total: 0.5 mg/dL (ref 0.2–1.0)
Calcium, Total: 9.5 mg/dL (ref 8.5–10.1)
Creatinine: 0.98 mg/dL (ref 0.60–1.30)
EGFR (African American): >60
EGFR (Non-African Amer.): >60
Glucose: 183 mg/dL — ABNORMAL HIGH (ref 65–99)
Osmolality: 278 (ref 275–301)
Potassium: 3.7 mmol/L (ref 3.5–5.1)
SGOT(AST): 102 U/L — ABNORMAL HIGH (ref 15–37)
SGPT (ALT): 37 U/L (ref 12–78)
Sodium: 137 mmol/L (ref 136–145)
Total Protein: 7 g/dL (ref 6.4–8.2)

## 2013-02-04 LAB — COMPREHENSIVE METABOLIC PANEL
Albumin: 3.4 g/dL (ref 3.4–5.0)
Alkaline Phosphatase: 309 U/L — ABNORMAL HIGH (ref 50–136)
Anion Gap: 7 (ref 7–16)
Bilirubin,Total: 0.5 mg/dL (ref 0.2–1.0)
Calcium, Total: 10 mg/dL (ref 8.5–10.1)
Chloride: 99 mmol/L (ref 98–107)
EGFR (Non-African Amer.): >60
SGOT(AST): 140 U/L — ABNORMAL HIGH (ref 15–37)
Total Protein: 7 g/dL (ref 6.4–8.2)

## 2013-02-09 ENCOUNTER — Ambulatory Visit: Payer: Self-pay | Admitting: Oncology

## 2013-02-11 LAB — CBC CANCER CENTER
Basophil #: 0 x10 3/mm (ref 0.0–0.1)
Basophil %: 0.4 %
Eosinophil #: 0 x10 3/mm (ref 0.0–0.7)
HCT: 33.9 % — ABNORMAL LOW (ref 40.0–52.0)
HGB: 11.2 g/dL — ABNORMAL LOW (ref 13.0–18.0)
Lymphocyte #: 0.8 x10 3/mm — ABNORMAL LOW (ref 1.0–3.6)
Lymphocyte %: 13.9 %
MCV: 84 fL (ref 80–100)
Neutrophil #: 3.9 x10 3/mm (ref 1.4–6.5)
Neutrophil %: 65.9 %
Platelet: 153 x10 3/mm (ref 150–440)
RDW: 17.1 % — ABNORMAL HIGH (ref 11.5–14.5)
WBC: 5.9 x10 3/mm (ref 3.8–10.6)

## 2013-02-25 LAB — CBC CANCER CENTER
Basophil #: 0 x10 3/mm (ref 0.0–0.1)
Eosinophil #: 0 x10 3/mm (ref 0.0–0.7)
HCT: 35.3 % — ABNORMAL LOW (ref 40.0–52.0)
Lymphocyte #: 0.9 x10 3/mm — ABNORMAL LOW (ref 1.0–3.6)
Lymphocyte %: 19.9 %
MCHC: 33.6 g/dL (ref 32.0–36.0)
Monocyte #: 0.8 x10 3/mm (ref 0.2–1.0)
Monocyte %: 17.4 %
Neutrophil %: 61.7 %
RBC: 4.23 10*6/uL — ABNORMAL LOW (ref 4.40–5.90)
WBC: 4.4 x10 3/mm (ref 3.8–10.6)

## 2013-02-25 LAB — COMPREHENSIVE METABOLIC PANEL
Anion Gap: 7 (ref 7–16)
Bilirubin,Total: 0.4 mg/dL (ref 0.2–1.0)
Calcium, Total: 10.3 mg/dL — ABNORMAL HIGH (ref 8.5–10.1)
Creatinine: 0.83 mg/dL (ref 0.60–1.30)
EGFR (Non-African Amer.): >60
Glucose: 148 mg/dL — ABNORMAL HIGH (ref 65–99)

## 2013-03-11 ENCOUNTER — Ambulatory Visit: Payer: Self-pay | Admitting: Oncology

## 2013-03-11 LAB — COMPREHENSIVE METABOLIC PANEL
BUN: 13 mg/dL (ref 7–18)
Bilirubin,Total: 0.5 mg/dL (ref 0.2–1.0)
Co2: 25 mmol/L (ref 21–32)
EGFR (African American): >60
EGFR (Non-African Amer.): >60
Osmolality: 270 (ref 275–301)
Potassium: 4.2 mmol/L (ref 3.5–5.1)
Sodium: 134 mmol/L — ABNORMAL LOW (ref 136–145)

## 2013-03-11 LAB — CBC CANCER CENTER
Basophil %: 0.6 %
Eosinophil %: 0.2 %
HCT: 35 % — ABNORMAL LOW (ref 40.0–52.0)
HGB: 11.4 g/dL — ABNORMAL LOW (ref 13.0–18.0)
Lymphocyte #: 0.9 x10 3/mm — ABNORMAL LOW (ref 1.0–3.6)
Lymphocyte %: 13.5 %
MCH: 27.3 pg (ref 26.0–34.0)
MCV: 84 fL (ref 80–100)
Monocyte %: 18.3 %
Neutrophil #: 4.2 x10 3/mm (ref 1.4–6.5)
Neutrophil %: 67.4 %
Platelet: 262 x10 3/mm (ref 150–440)
RBC: 4.18 10*6/uL — ABNORMAL LOW (ref 4.40–5.90)

## 2013-03-12 LAB — CANCER ANTIGEN 19-9: CA 19-9: 2872 U/mL — ABNORMAL HIGH (ref 0–35)

## 2013-03-31 LAB — COMPREHENSIVE METABOLIC PANEL
Anion Gap: 10 (ref 7–16)
Bilirubin,Total: 1.4 mg/dL — ABNORMAL HIGH (ref 0.2–1.0)
Calcium, Total: 9.8 mg/dL (ref 8.5–10.1)
Creatinine: 0.83 mg/dL (ref 0.60–1.30)
EGFR (African American): >60
EGFR (Non-African Amer.): >60
Glucose: 105 mg/dL — ABNORMAL HIGH (ref 65–99)
Osmolality: 268 (ref 275–301)
Potassium: 3.8 mmol/L (ref 3.5–5.1)
SGPT (ALT): 53 U/L (ref 12–78)
Sodium: 134 mmol/L — ABNORMAL LOW (ref 136–145)

## 2013-03-31 LAB — CBC CANCER CENTER
Basophil %: 0.6 %
Eosinophil #: 0.1 x10 3/mm (ref 0.0–0.7)
HGB: 10.9 g/dL — ABNORMAL LOW (ref 13.0–18.0)
Lymphocyte #: 1 x10 3/mm (ref 1.0–3.6)
Lymphocyte %: 15.6 %
MCH: 28.3 pg (ref 26.0–34.0)
MCV: 86 fL (ref 80–100)
Monocyte #: 1 x10 3/mm (ref 0.2–1.0)
Monocyte %: 16.2 %
Neutrophil #: 4.2 x10 3/mm (ref 1.4–6.5)
Platelet: 174 x10 3/mm (ref 150–440)
WBC: 6.3 x10 3/mm (ref 3.8–10.6)

## 2013-04-01 LAB — CANCER ANTIGEN 19-9: CA 19-9: 4442 U/mL — ABNORMAL HIGH (ref 0–35)

## 2013-04-11 ENCOUNTER — Ambulatory Visit: Payer: Self-pay | Admitting: Oncology

## 2013-04-14 LAB — CBC CANCER CENTER
Basophil #: 0 x10 3/mm (ref 0.0–0.1)
Basophil %: 0.8 %
Eosinophil #: 0 x10 3/mm (ref 0.0–0.7)
HGB: 10.6 g/dL — ABNORMAL LOW (ref 13.0–18.0)
Lymphocyte #: 0.6 x10 3/mm — ABNORMAL LOW (ref 1.0–3.6)
MCH: 27.9 pg (ref 26.0–34.0)
MCV: 87 fL (ref 80–100)
Monocyte #: 0.7 x10 3/mm (ref 0.2–1.0)
Monocyte %: 15.6 %
Neutrophil #: 3.3 x10 3/mm (ref 1.4–6.5)
Platelet: 152 x10 3/mm (ref 150–440)
RDW: 16.6 % — ABNORMAL HIGH (ref 11.5–14.5)
WBC: 4.8 x10 3/mm (ref 3.8–10.6)

## 2013-04-14 LAB — COMPREHENSIVE METABOLIC PANEL
Albumin: 2.7 g/dL — ABNORMAL LOW (ref 3.4–5.0)
Alkaline Phosphatase: 474 U/L — ABNORMAL HIGH (ref 50–136)
Bilirubin,Total: 1.8 mg/dL — ABNORMAL HIGH (ref 0.2–1.0)
Calcium, Total: 10.6 mg/dL — ABNORMAL HIGH (ref 8.5–10.1)
Chloride: 98 mmol/L (ref 98–107)
Glucose: 125 mg/dL — ABNORMAL HIGH (ref 65–99)
Potassium: 3.7 mmol/L (ref 3.5–5.1)
SGOT(AST): 133 U/L — ABNORMAL HIGH (ref 15–37)
Sodium: 133 mmol/L — ABNORMAL LOW (ref 136–145)

## 2013-04-21 ENCOUNTER — Encounter: Payer: Self-pay | Admitting: Oncology

## 2013-04-27 LAB — CBC CANCER CENTER
Basophil %: 0.5 %
Eosinophil #: 0 x10 3/mm (ref 0.0–0.7)
Eosinophil %: 0.3 %
HCT: 33.5 % — ABNORMAL LOW (ref 40.0–52.0)
HGB: 10.8 g/dL — ABNORMAL LOW (ref 13.0–18.0)
Lymphocyte #: 0.7 x10 3/mm — ABNORMAL LOW (ref 1.0–3.6)
MCH: 27.9 pg (ref 26.0–34.0)
MCV: 87 fL (ref 80–100)
Monocyte #: 0.7 x10 3/mm (ref 0.2–1.0)
Neutrophil #: 4.8 x10 3/mm (ref 1.4–6.5)
Neutrophil %: 76.3 %
Platelet: 240 x10 3/mm (ref 150–440)
RDW: 16.6 % — ABNORMAL HIGH (ref 11.5–14.5)
WBC: 6.3 x10 3/mm (ref 3.8–10.6)

## 2013-04-27 LAB — COMPREHENSIVE METABOLIC PANEL
Albumin: 2.3 g/dL — ABNORMAL LOW (ref 3.4–5.0)
Alkaline Phosphatase: 408 U/L — ABNORMAL HIGH (ref 50–136)
Anion Gap: 8 (ref 7–16)
BUN: 9 mg/dL (ref 7–18)
Bilirubin,Total: 3.9 mg/dL — ABNORMAL HIGH (ref 0.2–1.0)
Calcium, Total: 12 mg/dL — ABNORMAL HIGH (ref 8.5–10.1)
Chloride: 94 mmol/L — ABNORMAL LOW (ref 98–107)
Co2: 29 mmol/L (ref 21–32)
Glucose: 92 mg/dL (ref 65–99)
Osmolality: 261 (ref 275–301)
SGPT (ALT): 34 U/L (ref 12–78)
Total Protein: 6.3 g/dL — ABNORMAL LOW (ref 6.4–8.2)

## 2013-04-28 ENCOUNTER — Inpatient Hospital Stay: Payer: Self-pay | Admitting: Oncology

## 2013-04-28 LAB — CBC WITH DIFFERENTIAL/PLATELET
Basophil #: 0 10*3/uL (ref 0.0–0.1)
Eosinophil #: 0 10*3/uL (ref 0.0–0.7)
HCT: 32.3 % — ABNORMAL LOW (ref 40.0–52.0)
HGB: 10.7 g/dL — ABNORMAL LOW (ref 13.0–18.0)
Lymphocyte %: 10.2 %
MCH: 28.3 pg (ref 26.0–34.0)
MCHC: 33.1 g/dL (ref 32.0–36.0)
MCV: 86 fL (ref 80–100)
Monocyte #: 0.8 x10 3/mm (ref 0.2–1.0)
RBC: 3.78 10*6/uL — ABNORMAL LOW (ref 4.40–5.90)
RDW: 16.7 % — ABNORMAL HIGH (ref 11.5–14.5)
WBC: 6.6 10*3/uL (ref 3.8–10.6)

## 2013-04-29 LAB — CBC WITH DIFFERENTIAL/PLATELET
Basophil %: 0.6 %
Eosinophil #: 0 10*3/uL (ref 0.0–0.7)
Eosinophil %: 0 %
Lymphocyte %: 5.6 %
MCH: 27.9 pg (ref 26.0–34.0)
MCHC: 32.8 g/dL (ref 32.0–36.0)
MCV: 85 fL (ref 80–100)
Monocyte #: 0.7 x10 3/mm (ref 0.2–1.0)
Neutrophil #: 4.8 10*3/uL (ref 1.4–6.5)
RDW: 16.6 % — ABNORMAL HIGH (ref 11.5–14.5)
WBC: 5.8 10*3/uL (ref 3.8–10.6)

## 2013-04-29 LAB — APTT
Activated PTT: 54.9 secs — ABNORMAL HIGH (ref 23.6–35.9)
Activated PTT: 48.8 secs — ABNORMAL HIGH (ref 23.6–35.9)

## 2013-04-29 LAB — COMPREHENSIVE METABOLIC PANEL
Alkaline Phosphatase: 527 U/L — ABNORMAL HIGH (ref 50–136)
BUN: 11 mg/dL (ref 7–18)
Bilirubin,Total: 2.6 mg/dL — ABNORMAL HIGH (ref 0.2–1.0)
Calcium, Total: 10.5 mg/dL — ABNORMAL HIGH (ref 8.5–10.1)
Chloride: 99 mmol/L (ref 98–107)
Creatinine: 0.76 mg/dL (ref 0.60–1.30)
EGFR (Non-African Amer.): >60
Glucose: 107 mg/dL — ABNORMAL HIGH (ref 65–99)
Potassium: 3.9 mmol/L (ref 3.5–5.1)
SGOT(AST): 124 U/L — ABNORMAL HIGH (ref 15–37)
Sodium: 134 mmol/L — ABNORMAL LOW (ref 136–145)
Total Protein: 5.6 g/dL — ABNORMAL LOW (ref 6.4–8.2)

## 2013-04-29 LAB — AMMONIA: Ammonia, Plasma: 55 mcmol/L — ABNORMAL HIGH (ref 11–32)

## 2013-04-30 LAB — COMPREHENSIVE METABOLIC PANEL
Albumin: 2.1 g/dL — ABNORMAL LOW (ref 3.4–5.0)
Anion Gap: 7 (ref 7–16)
BUN: 13 mg/dL (ref 7–18)
Bilirubin,Total: 2.7 mg/dL — ABNORMAL HIGH (ref 0.2–1.0)
Calcium, Total: 9.5 mg/dL (ref 8.5–10.1)
Chloride: 100 mmol/L (ref 98–107)
EGFR (African American): >60
Glucose: 101 mg/dL — ABNORMAL HIGH (ref 65–99)
Osmolality: 268 (ref 275–301)
Potassium: 3.7 mmol/L (ref 3.5–5.1)
Sodium: 134 mmol/L — ABNORMAL LOW (ref 136–145)

## 2013-04-30 LAB — APTT: Activated PTT: 50.8 secs — ABNORMAL HIGH (ref 23.6–35.9)

## 2013-05-01 LAB — CBC WITH DIFFERENTIAL/PLATELET
Basophil #: 0 10*3/uL (ref 0.0–0.1)
Eosinophil #: 0 10*3/uL (ref 0.0–0.7)
Eosinophil %: 0.4 %
HCT: 31.9 % — ABNORMAL LOW (ref 40.0–52.0)
Lymphocyte #: 0.9 10*3/uL — ABNORMAL LOW (ref 1.0–3.6)
MCV: 85 fL (ref 80–100)
Neutrophil #: 4.6 10*3/uL (ref 1.4–6.5)
Platelet: 181 10*3/uL (ref 150–440)

## 2013-05-01 LAB — BASIC METABOLIC PANEL
BUN: 11 mg/dL (ref 7–18)
Calcium, Total: 9 mg/dL (ref 8.5–10.1)
Co2: 28 mmol/L (ref 21–32)
Creatinine: 0.72 mg/dL (ref 0.60–1.30)
EGFR (African American): >60
Glucose: 88 mg/dL (ref 65–99)
Osmolality: 269 (ref 275–301)
Potassium: 3.4 mmol/L — ABNORMAL LOW (ref 3.5–5.1)
Sodium: 135 mmol/L — ABNORMAL LOW (ref 136–145)

## 2013-05-04 LAB — CBC CANCER CENTER
Basophil %: 1.5 %
Eosinophil #: 0 x10 3/mm (ref 0.0–0.7)
Eosinophil %: 0.5 %
HCT: 34.3 % — ABNORMAL LOW (ref 40.0–52.0)
Lymphocyte %: 8.3 %
MCHC: 32.7 g/dL (ref 32.0–36.0)
MCV: 86 fL (ref 80–100)
Neutrophil #: 6.8 x10 3/mm — ABNORMAL HIGH (ref 1.4–6.5)
Platelet: 261 x10 3/mm (ref 150–440)
WBC: 8.2 x10 3/mm (ref 3.8–10.6)

## 2013-05-04 LAB — COMPREHENSIVE METABOLIC PANEL
Albumin: 2.5 g/dL — ABNORMAL LOW (ref 3.4–5.0)
Anion Gap: 6 — ABNORMAL LOW (ref 7–16)
BUN: 12 mg/dL (ref 7–18)
Bilirubin,Total: 3.1 mg/dL — ABNORMAL HIGH (ref 0.2–1.0)
Calcium, Total: 8.5 mg/dL (ref 8.5–10.1)
Co2: 28 mmol/L (ref 21–32)
Creatinine: 0.74 mg/dL (ref 0.60–1.30)
Glucose: 109 mg/dL — ABNORMAL HIGH (ref 65–99)
SGOT(AST): 184 U/L — ABNORMAL HIGH (ref 15–37)
SGPT (ALT): 45 U/L (ref 12–78)
Sodium: 135 mmol/L — ABNORMAL LOW (ref 136–145)
Total Protein: 6.4 g/dL (ref 6.4–8.2)

## 2013-05-11 ENCOUNTER — Ambulatory Visit: Payer: Self-pay | Admitting: Oncology

## 2013-05-11 ENCOUNTER — Encounter: Payer: Self-pay | Admitting: Oncology

## 2013-05-11 LAB — CBC CANCER CENTER
Basophil #: 0.1 x10 3/mm (ref 0.0–0.1)
Eosinophil #: 0 x10 3/mm (ref 0.0–0.7)
HCT: 35.1 % — ABNORMAL LOW (ref 40.0–52.0)
HGB: 11.3 g/dL — ABNORMAL LOW (ref 13.0–18.0)
Lymphocyte #: 0.7 x10 3/mm — ABNORMAL LOW (ref 1.0–3.6)
Lymphocyte %: 13 %
MCH: 28.3 pg (ref 26.0–34.0)
MCHC: 32.3 g/dL (ref 32.0–36.0)
MCV: 88 fL (ref 80–100)
Monocyte #: 0.8 x10 3/mm (ref 0.2–1.0)
Neutrophil #: 4 x10 3/mm (ref 1.4–6.5)
Platelet: 223 x10 3/mm (ref 150–440)
RBC: 4.01 10*6/uL — ABNORMAL LOW (ref 4.40–5.90)
RDW: 17.2 % — ABNORMAL HIGH (ref 11.5–14.5)
WBC: 5.7 x10 3/mm (ref 3.8–10.6)

## 2013-05-11 LAB — COMPREHENSIVE METABOLIC PANEL
Alkaline Phosphatase: 469 U/L — ABNORMAL HIGH
BUN: 8 mg/dL (ref 7–18)
Bilirubin,Total: 2.7 mg/dL — ABNORMAL HIGH (ref 0.2–1.0)
Chloride: 101 mmol/L (ref 98–107)
Creatinine: 0.75 mg/dL (ref 0.60–1.30)
Glucose: 104 mg/dL — ABNORMAL HIGH (ref 65–99)
Potassium: 3.2 mmol/L — ABNORMAL LOW (ref 3.5–5.1)
SGOT(AST): 147 U/L — ABNORMAL HIGH (ref 15–37)
SGPT (ALT): 37 U/L (ref 12–78)
Sodium: 137 mmol/L (ref 136–145)
Total Protein: 6.1 g/dL — ABNORMAL LOW (ref 6.4–8.2)

## 2013-06-01 LAB — CBC CANCER CENTER
Basophil #: 0 x10 3/mm (ref 0.0–0.1)
Basophil %: 0.5 %
Eosinophil #: 0 x10 3/mm (ref 0.0–0.7)
Eosinophil %: 0.3 %
Lymphocyte %: 8.9 %
MCH: 28.2 pg (ref 26.0–34.0)
MCHC: 32.3 g/dL (ref 32.0–36.0)
MCV: 87 fL (ref 80–100)
Monocyte #: 0.5 x10 3/mm (ref 0.2–1.0)
Monocyte %: 6 %
Neutrophil #: 6.3 x10 3/mm (ref 1.4–6.5)
Neutrophil %: 84.3 %
Platelet: 211 x10 3/mm (ref 150–440)
RBC: 4.24 10*6/uL — ABNORMAL LOW (ref 4.40–5.90)
RDW: 17.9 % — ABNORMAL HIGH (ref 11.5–14.5)
WBC: 7.5 x10 3/mm (ref 3.8–10.6)

## 2013-06-01 LAB — COMPREHENSIVE METABOLIC PANEL
Albumin: 2.2 g/dL — ABNORMAL LOW (ref 3.4–5.0)
Alkaline Phosphatase: 651 U/L — ABNORMAL HIGH
Anion Gap: 7 (ref 7–16)
BUN: 13 mg/dL (ref 7–18)
Co2: 29 mmol/L (ref 21–32)
Creatinine: 0.71 mg/dL (ref 0.60–1.30)
EGFR (Non-African Amer.): >60
Osmolality: 262 (ref 275–301)
SGOT(AST): 194 U/L — ABNORMAL HIGH (ref 15–37)
Total Protein: 5.9 g/dL — ABNORMAL LOW (ref 6.4–8.2)

## 2013-06-11 ENCOUNTER — Ambulatory Visit: Payer: Self-pay | Admitting: Oncology

## 2013-06-29 LAB — CBC CANCER CENTER
BASOS ABS: 0 x10 3/mm (ref 0.0–0.1)
Basophil %: 0.4 %
EOS ABS: 0 x10 3/mm (ref 0.0–0.7)
EOS PCT: 0.5 %
HCT: 38.8 % — AB (ref 40.0–52.0)
HGB: 12.6 g/dL — ABNORMAL LOW (ref 13.0–18.0)
Lymphocyte #: 0.8 x10 3/mm — ABNORMAL LOW (ref 1.0–3.6)
Lymphocyte %: 11 %
MCH: 29.1 pg (ref 26.0–34.0)
MCHC: 32.4 g/dL (ref 32.0–36.0)
MCV: 90 fL (ref 80–100)
Monocyte #: 0.6 x10 3/mm (ref 0.2–1.0)
Monocyte %: 7.6 %
Neutrophil #: 6.1 x10 3/mm (ref 1.4–6.5)
Neutrophil %: 80.5 %
Platelet: 201 x10 3/mm (ref 150–440)
RBC: 4.32 10*6/uL — AB (ref 4.40–5.90)
RDW: 17.2 % — ABNORMAL HIGH (ref 11.5–14.5)
WBC: 7.6 x10 3/mm (ref 3.8–10.6)

## 2013-06-29 LAB — COMPREHENSIVE METABOLIC PANEL
ALBUMIN: 2.2 g/dL — AB (ref 3.4–5.0)
AST: 237 U/L — AB (ref 15–37)
Alkaline Phosphatase: 702 U/L — ABNORMAL HIGH
Anion Gap: 10 (ref 7–16)
BUN: 24 mg/dL — ABNORMAL HIGH (ref 7–18)
Bilirubin,Total: 3.9 mg/dL — ABNORMAL HIGH (ref 0.2–1.0)
CALCIUM: 10.1 mg/dL (ref 8.5–10.1)
Chloride: 93 mmol/L — ABNORMAL LOW (ref 98–107)
Co2: 30 mmol/L (ref 21–32)
Creatinine: 1.38 mg/dL — ABNORMAL HIGH (ref 0.60–1.30)
EGFR (African American): >60
EGFR (Non-African Amer.): 54 — ABNORMAL LOW
Glucose: 101 mg/dL — ABNORMAL HIGH (ref 65–99)
OSMOLALITY: 271 (ref 275–301)
Potassium: 3.5 mmol/L (ref 3.5–5.1)
SGPT (ALT): 28 U/L (ref 12–78)
Sodium: 133 mmol/L — ABNORMAL LOW (ref 136–145)
TOTAL PROTEIN: 6 g/dL — AB (ref 6.4–8.2)

## 2013-07-12 ENCOUNTER — Ambulatory Visit: Payer: Self-pay | Admitting: Oncology

## 2013-07-12 DEATH — deceased

## 2013-08-02 IMAGING — CT CT ABDOMEN W/ CM
1 of 2 series · 14 of 32 positions shown, 18 images · non-contrast
Comparison: none

REASON FOR EXAM: Pancreatic cancer follow up
COMMENTS:

[Series 2: 3mm soft tissue · axial · 0.78mm/px · z∈[-870,-594]mm · 14 of 102 slices shown, 18 images]
[im 5/102  soft-tissue]
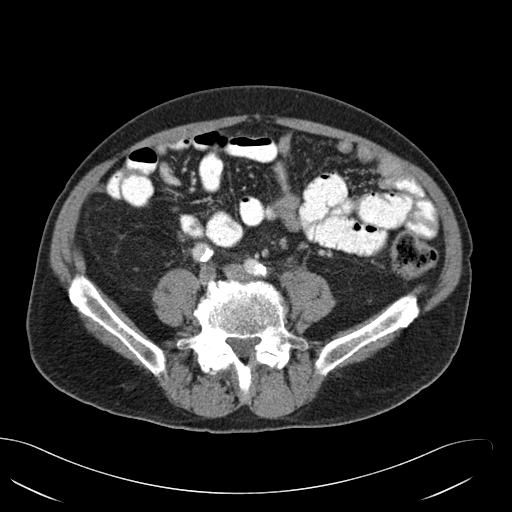
[im 5/102  bone]
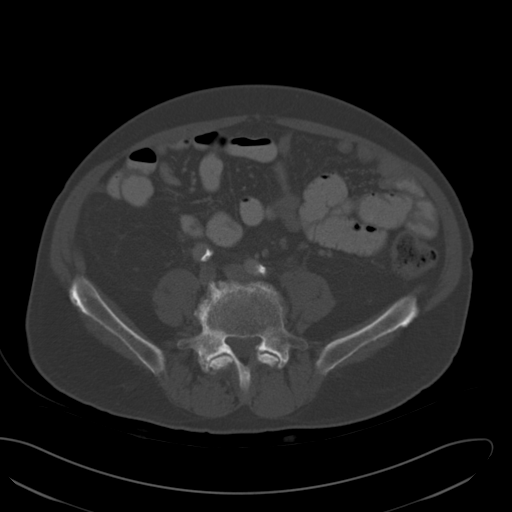
[im 15/102  soft-tissue]
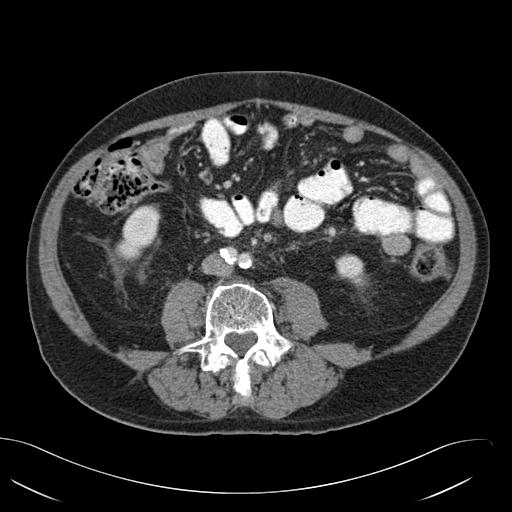
[im 25/102  soft-tissue]
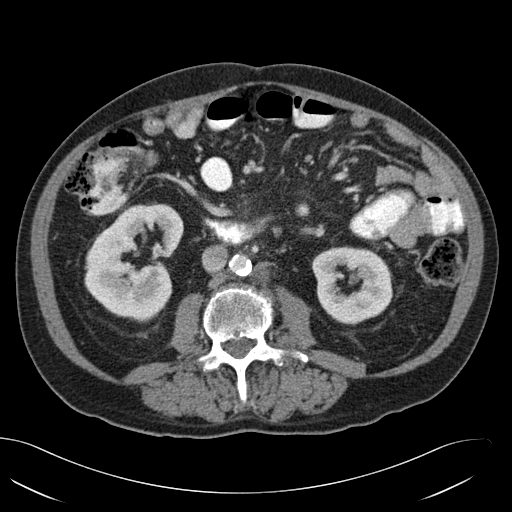
[im 29/102  soft-tissue]
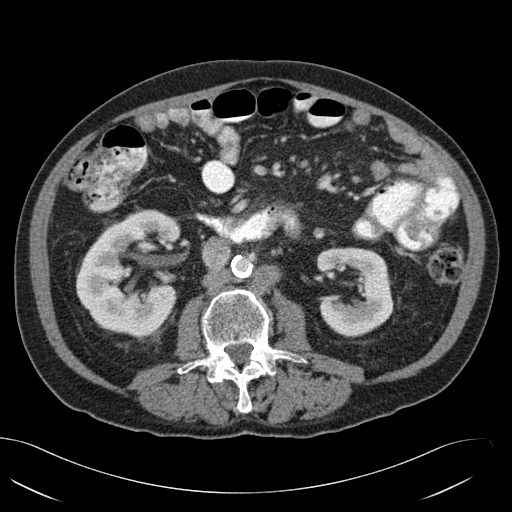
[im 39/102  soft-tissue]
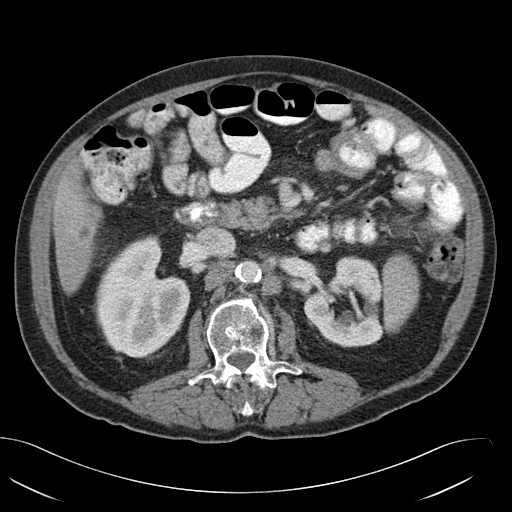
[im 49/102  soft-tissue]
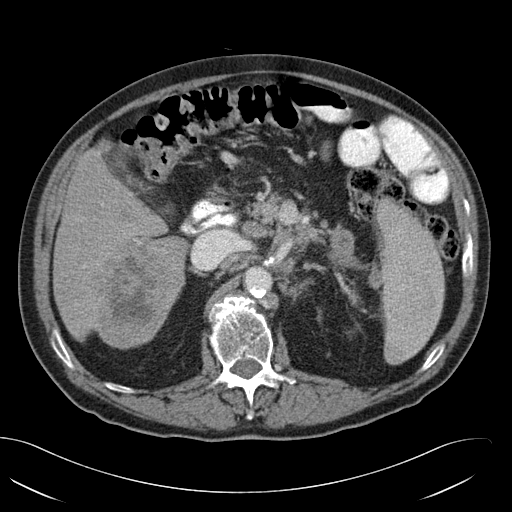
[im 53/102  soft-tissue]
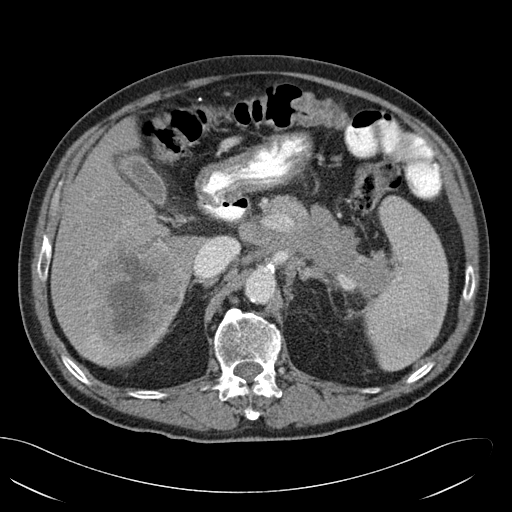
[im 63/102  soft-tissue]
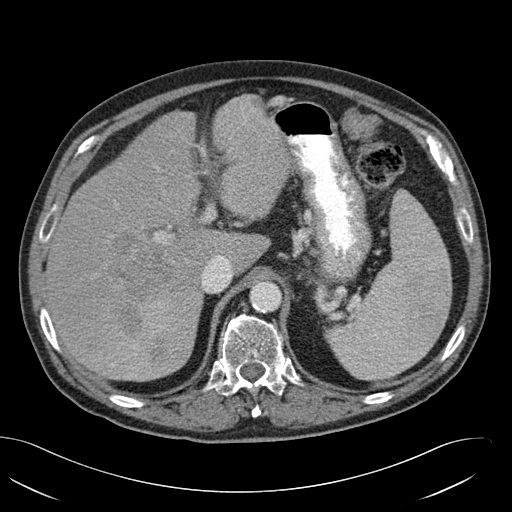
[im 73/102  soft-tissue]
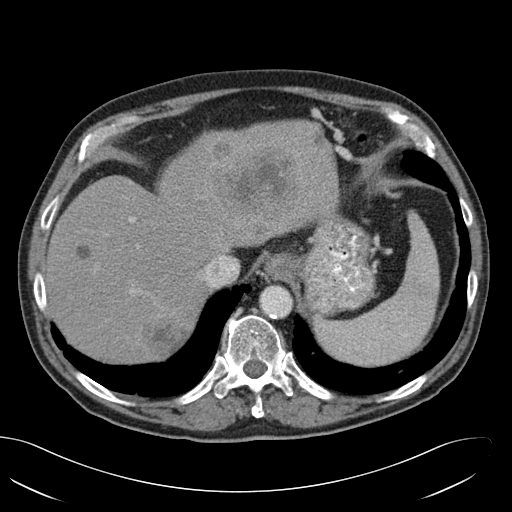
[im 73/102  bone]
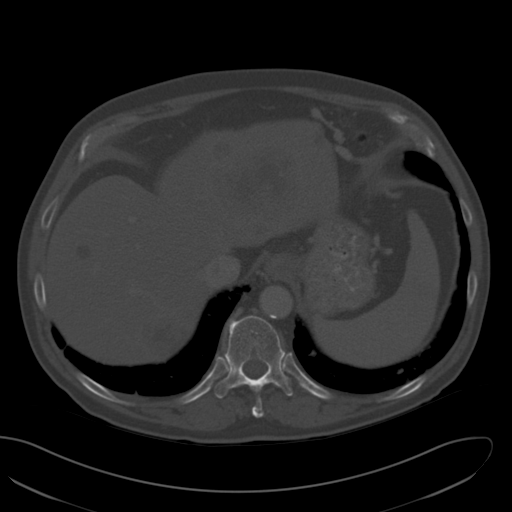
[im 77/102  soft-tissue]
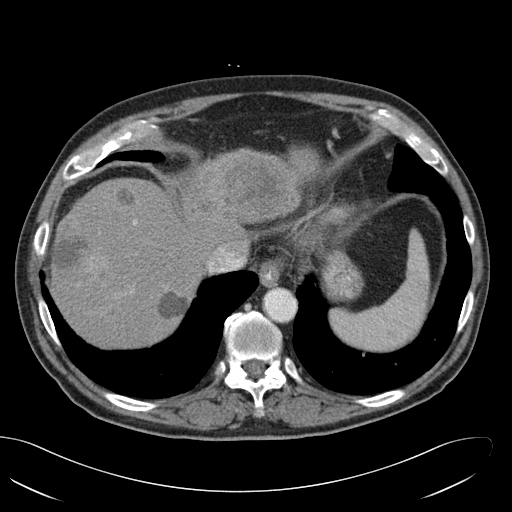
[im 82/102  lung]
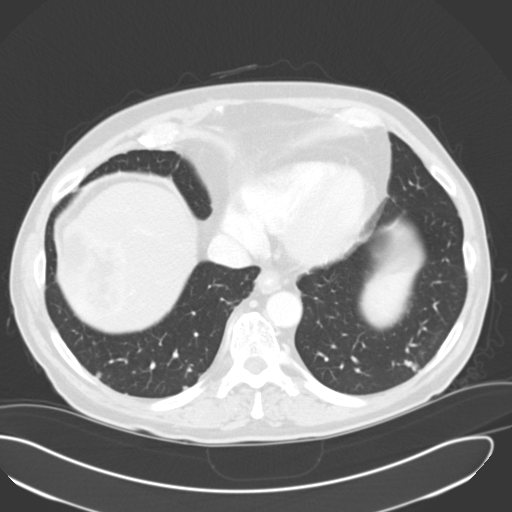
[im 87/102  soft-tissue]
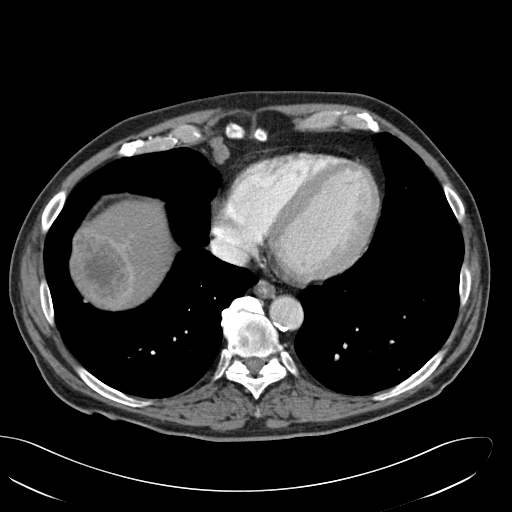
[im 87/102  lung]
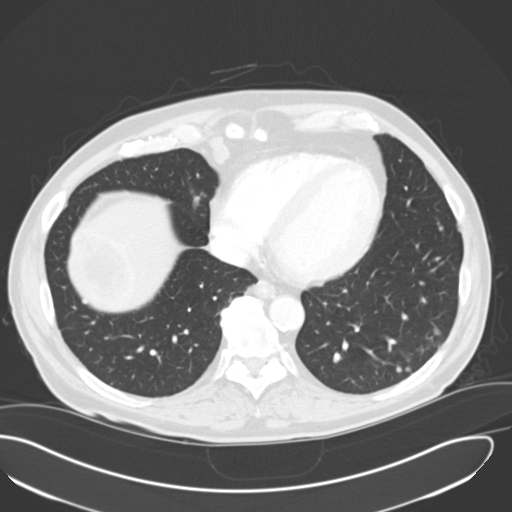
[im 92/102  lung]
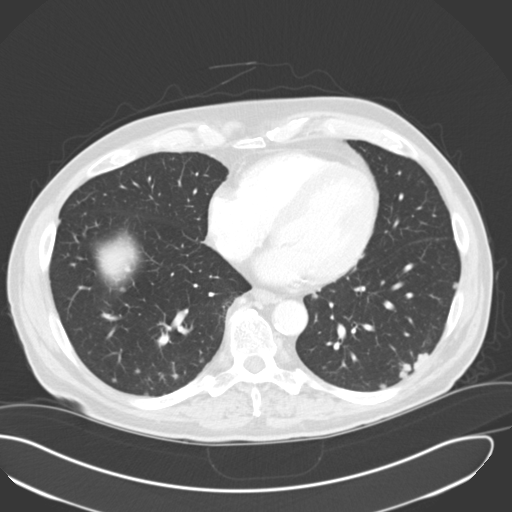
[im 97/102  soft-tissue]
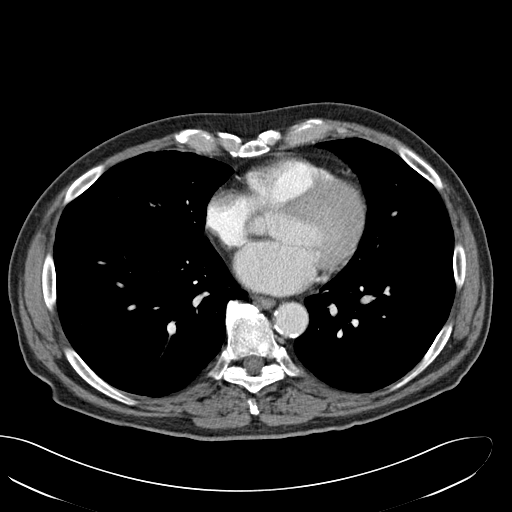
[im 97/102  lung]
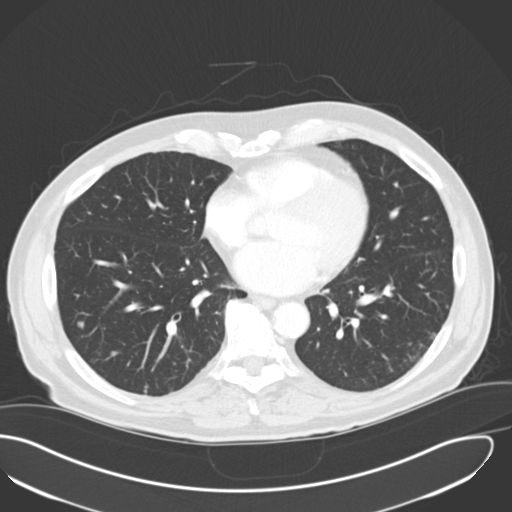

[14 of 32 positions shown; findings below may reference images not displayed]

PROCEDURE:     CT  - CT ABDOMEN STANDARD W  - October 01, 2012  [DATE]

RESULT:     Axial CT scanning was performed through the abdomen with
reconstructions at 3 mm intervals and slice thicknesses. The patient
received 100 cc of [id number] and also received oral contrast material.
Review of multiplanar reconstructed images was performed separately on the
VIA monitor.

Again demonstrated are multiple large masses within the liver compatible
with metastatic disease. The largest in the left lobe now measures at least
7.4 cm in diameter. The largest in the right lobe measures 9.5 cm in
greatest dimension. The pancreas remains abnormal with irregular masses
associated with the body. These are more conspicuous than in the past. The
maximal AP dimension of the pancreas is approximately 4.1 centimeters today
where previously it measured approximately 3.9 cm. There is no pancreatic
ductal dilation. The gallbladder is only partially distended.

The stomach is partially distended and grossly normal. There is mild
splenomegaly which has become more conspicuous since the previous study. The
maximal AP dimension now is 15 cm. There are no adrenal masses but there is
enlargement of left periaortic lymph nodes which have been previously
described. The maximal oblique AP dimension today is 3.4 cm in maximal
oblique transverse dimension is 1.6 cm. This has slightly increased in size
and is demonstrated best on image 56. There are other enlarged periaortic
lymph nodes. On image 70 immediately anterior to the abdominal aorta a lymph
node measures 1.8 cm in greatest dimension and has slightly increased in
size. The kidneys enhance normally with no evidence of obstruction. There is
stable cortical thinning laterally in the upper pole of the left kidney.

The partially contrast-filled loops of small and large bowel are normal in
appearance. The caliber of the abdominal aorta is normal. The lung bases
exhibit numerous subcentimeter pulmonary nodules. There is one nodule seen
best on image 18 that is pleural-based in the left lower lobe which has
increased slightly in size. It measures 1.4 cm in oblique transverse
dimension x 0.8 cm in oblique AP dimension. The lower thoracic and upper
lumbar vertebral bodies are preserved in height.
IMPRESSION: 1. Overall there has been mild progression of the tumor burden within the
pancreas, liver, retroperitoneal lymph nodes, and at the lung bases.
2. There has been some progression in the patient's mild splenomegaly.
3. There is no evidence of acute bowel abnormality nor acute abnormality of
the kidneys.

[REDACTED]

## 2014-02-28 IMAGING — XA IR VASCULAR PROCEDURE
2 series · 3 of 3 positions shown · IV contrast (IODINE)
Comparison: none

[Series 1: ivc · 2 of 2 slices shown (1 of 2)]
[im 1/2]
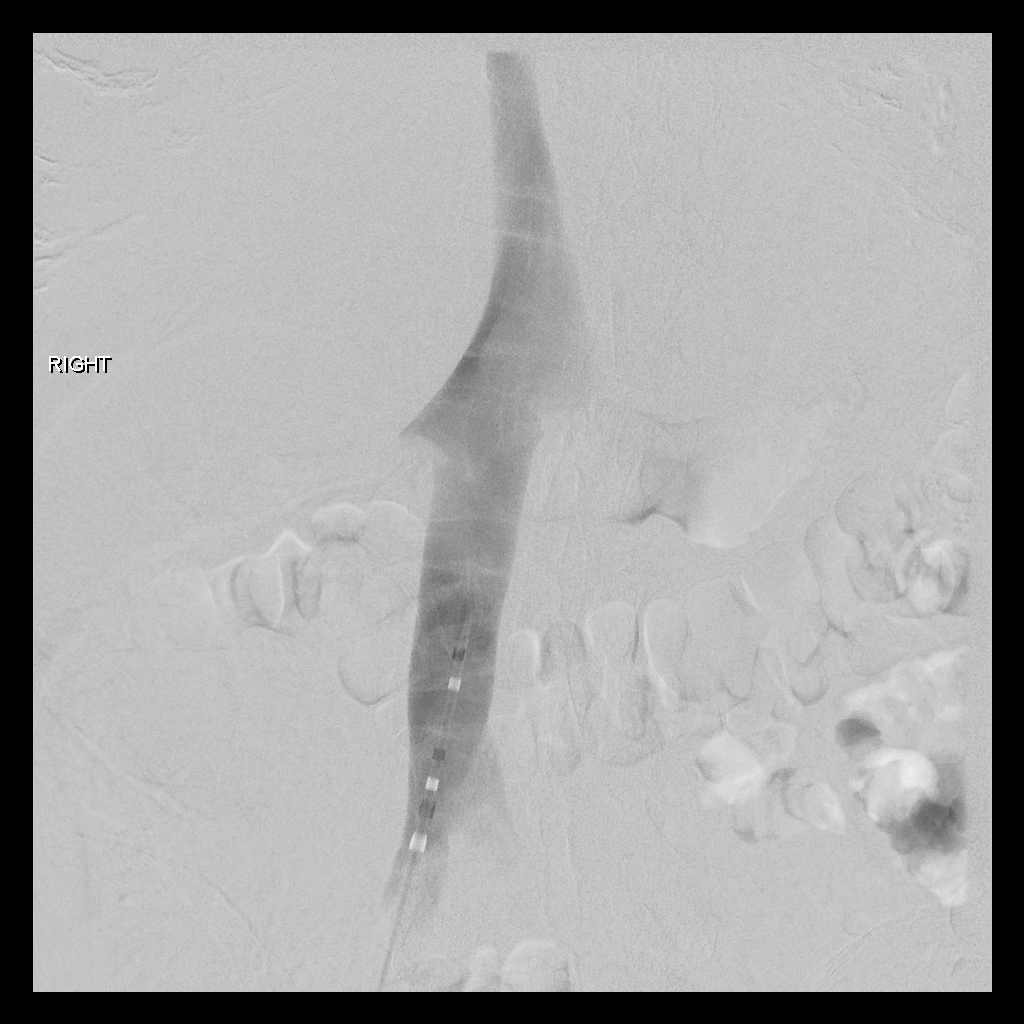
[im 2/2]
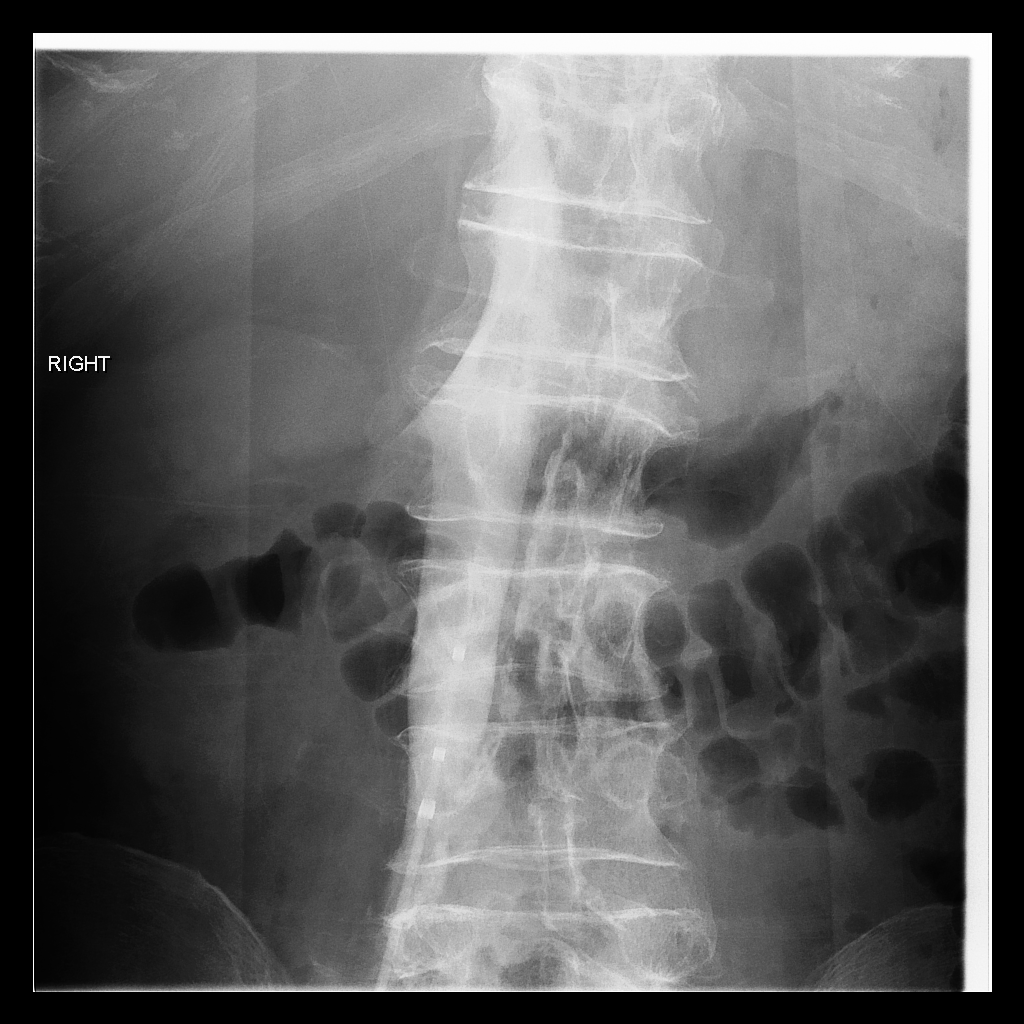

[Series 3: ivc · 1 of 1 slices shown (2 of 2)]
[im 1/1]
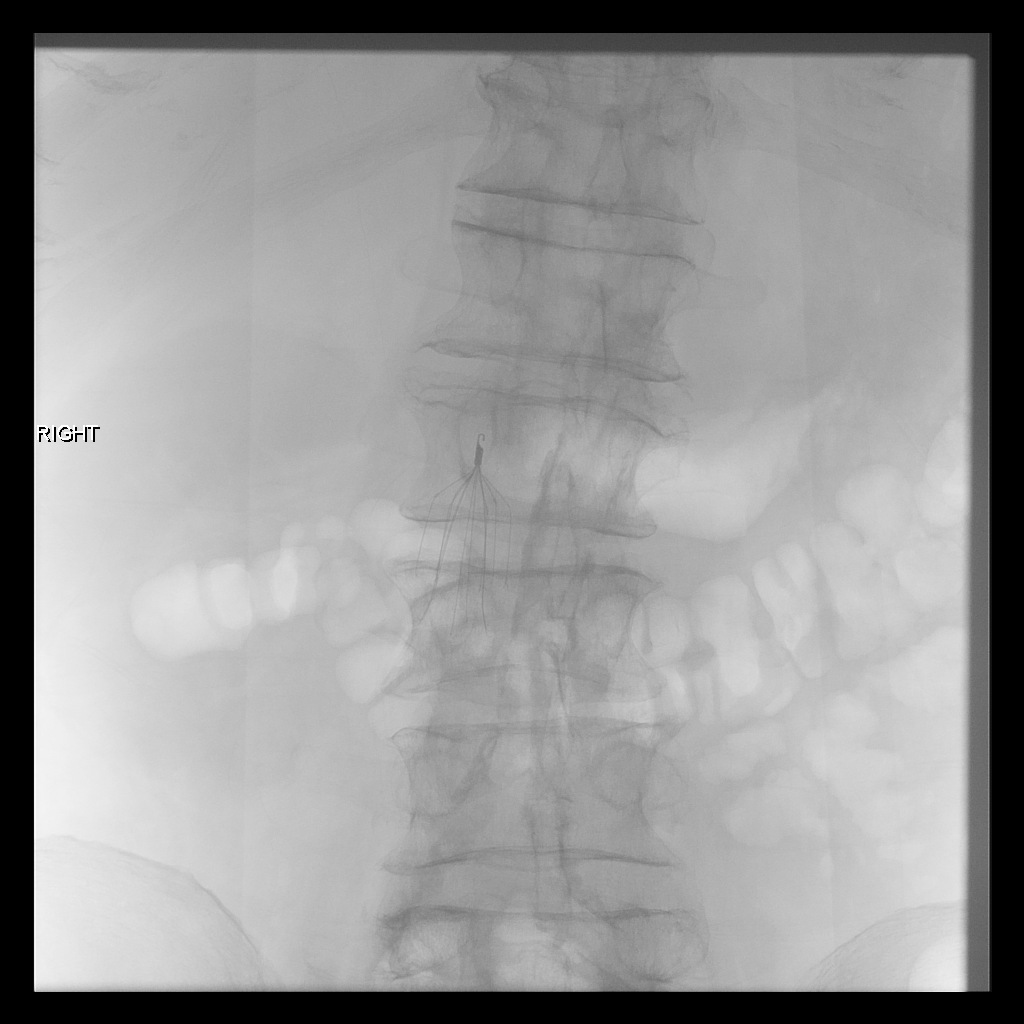

[3 of 3 positions shown; findings below may reference images not displayed]

IMAGES IMPORTED FROM THE [employer] WORKFLOW SYSTEM
NO DICTATION FOR STUDY

## 2014-09-28 NOTE — Op Note (Signed)
PATIENT NAME:  Alexander CohenSMITH, [name] MR#:  [id number] DATE OF BIRTH:  [birth year]  DATE OF PROCEDURE:  01/18/2012  PREOPERATIVE DIAGNOSIS: Carcinoma of pancreas.   POSTOPERATIVE DIAGNOSIS: Carcinoma of pancreas.   OPERATION: Insertion of venous access port in the left subclavian vein with ultrasound and fluoroscopic guidance.   SURGEON: Kathreen CosierS. G. Sankar, MD   ANESTHESIA: Local with a mixture of 0.5% Marcaine and 1% Xylocaine with anesthesia monitoring.   COMPLICATIONS: None.   ESTIMATED BLOOD LOSS: Minimal.   DRAINS: None.   PROCEDURE: The patient was placed in the supine position on the operating table. The left upper chest and lower neck area were prepped and draped out as a sterile field. With the patient in slight Trendelenburg and adequate sedation and monitoring, ultrasound probe was utilized to locate the subclavian vein beneath the lateral end of the clavicle. A local anesthetic was instilled and a small skin incision was made. Through this the needle was positioned into the subclavian vein with free withdrawal of blood. The guidewire was then positioned followed by placement of the introducer and dilator and subsequent placement of the catheter and the outer sheath was removed. At this point fluoroscopy was used and was noted the catheter was positioned going into the superior vena cava and right atrial level. The local anesthetic was then instilled over the second costal cartilage area. A second incision was made and with cautery a subcutaneous pocket was then created. The catheter was tunneled through to the site, cut to approximate length, and fixed to a prefilled port. The port was placed in the pocket and anchored to the underlying fascia with three stitches of 2-0 Prolene. It was then flushed through with 10 mL of heparinized saline. Fluoroscopy was again utilized to ensure the catheter was still in proper position and after verification the wounds were closed. Subcutaneous tissue was closed  with 3-0 Vicryl and the skin with subcuticular 4-0 Vicryl covered with Dermabond. The procedure was well tolerated. He was returned to the recovery room in stable condition.   ____________________________ S.Wynona LunaG. Sankar, MD sgs:drc D: 01/18/2012 08:25:33 ET T: 01/18/2012 10:14:49 ET JOB#: [id number]  cc: Timoteo ExposeS.G. Evette CristalSankar, MD, <Dictator> [facility] Center[facility]PLAPUTH Wynona LunaG SANKAR MD ELECTRONICALLY SIGNED 01/18/2012 11:24

## 2014-10-01 NOTE — Discharge Summary (Signed)
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 Chief Complaint/Diagnosis  1. 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  Biopsies consistent with adenocarcinoma.  (Neuroendocrine tumor ruled out) diagnosis in June of 2013 T3, N1, M1 tumor stage IV disease 2. Started on Abraxane and gemcitabine in July, 2013 3. Left lower extremity thrombosis diagnosis in July 2013 (patient was started on xarelto) 4. Progressing disease on gemcitabine andAbraxane January of 2014 5. Starting FOLFOX and CPT-11 on July 07, 2012 6.patient has been started on gemcitabine (fix rate) oxaliplatin and to see whether or diarrhea improves .  September of 2014 7. progressive  disease on hold chemotherapywas started on 5-FU by continuous infusion   Chief Complaint/History of Present Illness cont'd ?? HPI  64 year old  gentleman with carcinoma of pancreas metastases to   liver here for continuation of chemotherapy.  patient was [facility] at [facility] for any investigational treatment program.  His CT scan of abdomen and chest revealed multiple pulmonary nodule.  Pulmonary emboli.  Hypercalcemia.  Patient started having nausea vomiting could not eat or drink anything.  Also was somewhat confused.  Calcium level yesterday was 12.  Ammonia level was high. Is and is now being admitted in the hospital for control of hypercalcemia.  We will treat pulmonary embolism because resistant toXeralto and patient was on Lovenox. Overall declining condition.  Losing weight.patient has increasing  abdominal pain   Hepatic:  20-Nov-14 05:19   Bilirubin, Total  2.7  Alkaline Phosphatase  536  SGPT (ALT) 35  SGOT (AST)  132  Total Protein, Serum  5.5  Albumin, Serum  2.1  Routine Chem:  20-Nov-14 05:19   Glucose, Serum  101  BUN 13  Creatinine (comp) 0.78  Sodium, Serum  134  Potassium, Serum 3.7  Chloride, Serum 100  CO2, Serum 27  Calcium (Total), Serum 9.5  Anion Gap 7  Osmolality (calc) 268  eGFR (African American) >60  eGFR (Non-African American) >60 (eGFR values <56m/min/1.73 m2 may be an indication of chronic kidney disease (CKD). Calculated eGFR is useful in patients with stable renal function. The eGFR calculation will not be reliable in acutely ill patients when serum creatinine is changing rapidly. It is not useful in  patients on dialysis. The eGFR calculation may not be applicable to patients at the low and high extremes of body sizes, pregnant women, and vegetarians.)  Routine Coag:  20-Nov-14 05:19   Activated PTT (APTT)  50.8 (A HCT value >55% may artifactually increase the APTT. In one study, the increase was an average of 19%. Reference: "Effect on Routine and Special Coagulation Testing Values of Citrate Anticoagulant Adjustment in Patients with High HCT Values." American Journal of Clinical Pathology 22703;500:938-182)   PERTINENT RADIOLOGY STUDIES: XRay:    17-Nov-14 10:23, Chest PA and Lateral  Chest PA and Lateral   REASON FOR EXAM:    cough SOB  COMMENTS:       PROCEDURE: DXR - DXR CHEST PA (OR AP) AND LATERAL  - Apr 27 2013 10:23AM     CLINICAL DATA:  Cough and shortness of  breath, history of pancreatic  and liver neoplasia    EXAM:  CHEST  2 VIEW    COMPARISON:  01/18/2012    FINDINGS:  A left-sided port a catheter is identified with tip projecting in  the region of the superior vena caval right atrial junction. Diffuse  innumerable pulmonary nodules are appreciated within the right and  left hemithoraces. No focal regions of  consolidation no focal  infiltrates are appreciated. Hypoventilation is appreciated. The  cardiac silhouette and visualized bony skeleton are unremarkable.  There is minimal blunting of the costophrenic angles.     IMPRESSION:  1. Innumerable pulmonary nodules consistent with metastatic disease  considering the patient's history  2. No focal regions of consolidation or focal infiltrates.  3. Findings likely reflecting trace bilateral effusions      Electronically Signed    By: Margaree Mackintosh M.D.    On: 04/27/2013 11:25         Verified By: Mikki Santee, M.D., MD  [place]:    09-Oct-14 15:32, [place] Color Flow Doppler Low Extrem Left (Leg)  [place] Color Flow Doppler Low Extrem Left (Leg)   REASON FOR EXAM:    CALL REPORT [id number] LEFT LEG  PAIN SWELLING EVAL DVT  COMMENTS:       PROCEDURE: [place]  - US DOPPLER LOW EXTR LEFT  - Mar 19 2013  3:32PM     RESULT: Doppler interrogation of the deep venous system of the left leg   is performed from the common femoral vein through the popliteal vein. The   common femoral vein shows complete compressibility. Femoral vein shows   findings consistent with occlusive thrombus in the femoral vein and   popliteal vein with incomplete compressibility. There is flow in the   saphenous vein and in the profunda femoris vein. The very proximal   superficial femoral vein show some evidence of flow and nonocclusive   thrombus. In the mid to distal portion there is lack of flow.    IMPRESSION:  Extensive thrombus throughout the femoral vein and popliteal     vein with areas of occlusion. Thrombus extends into the posterior tibial   and peroneal vein.    Dictation Site: 2        Verified By: Sundra Aland, M.D., MD  LabUnknown:  PACS Image     [phone] 10:23, Chest PA and Lateral  PACS Image    Hospital Course:  Hospital Course During hospital stay patient was started on IV fluid.  IV steroid.  Received intravenous Zometa for hypercalcemia.   Gradually calcium level improved.  Patient also had Deep vein  thrombosis and recurrent pulmonary embolism refractory to Coumadin and Lovenox so patient underwent IVC filter placement.. Gradually patient's condition improved.  And patient was discharged with advice to get followup.  Overall general condition is poor.  Prognosis is poor because of advanced pancreatic cancer   Condition on Discharge Poor   DISCHARGE INSTRUCTIONS HOME MEDS:  Medication Reconciliation: Patient's Home Medications at Discharge:     Medication Instructions  mirtazapine 15 mg oral tablet  1 tab(s) orally once a day (at bedtime)   omeprazole 20 mg oral delayed release capsule  1 cap(s) orally 2 times a day   prednisone 20 mg oral tablet  1 tab(s) orally once a day   opium 10% (equivalent to morphine 10 mg/ml) oral tincture  2.5 milliliter(s) orally 4 times a day   ondansetron 4 mg oral tablet  1 tab(s) orally every  8 hours, As Needed   acetaminophen-hydrocodone 325 mg-5 mg oral tablet  1 tab(s) orally every 4 to 6 hours, As Needed   fentanyl  12 microgram(s) topically every 3 days   lomotil 0.025 mg-2.5 mg oral tablet  2 tab(s) orally 4 times a day, As Needed - for Diarrhea   lovenox 80 mg/0.8 ml injectable solution  1 dose(s) injectable 2 times a day    PRESCRIPTIONS: PRINTED AND GIVEN TO PATIENT/FAMILY   Physician's Instructions:  Home Health? No   Home Oxygen? No   Diet Regular   Dietary Supplements None   Diet Consistency Regular Consistency   Activity Limitations As tolerated   Referrals hospice   Return to Work Not Applicable   Time frame for Follow Up Appointment 1-2 weeks   Electronic Signatures: Zenya Hickam, Martie Lee (MD)  (Signed 08-Dec-14 16:38)  Authored: ADMISSION DATE AND DIAGNOSIS, CHIEF COMPLAINT/HPI, PERTINENT LABS, [place], PATIENT INSTRUCTIONS   Last Updated: 08-Dec-14 16:38 by Jobe Gibbon (MD)

## 2014-10-01 NOTE — Op Note (Signed)
PATIENT NAME:  Alexander Warren, Alexander Warren MR#:  [id number] DATE OF BIRTH:  1951/01/14  DATE OF PROCEDURE:  04/29/2013  PREOPERATIVE DIAGNOSES:  1. Pulmonary embolism associated with deep vein thrombosis while on Lovenox.  2. Failure of anticoagulation.  3. Stage IV pancreatic cancer.  POSTOPERATIVE DIAGNOSES:  1. Pulmonary embolism associated with deep vein thrombosis while on Lovenox.  2. Failure of anticoagulation.  3. Stage IV pancreatic cancer.  PROCEDURE PERFORMED: 1. Inferior venacavogram.  2. Placement of infrarenal inferior vena caval filter, Meridian type.   SURGEON: Renford DillsGregory G. Nastashia Gallo, MD  SEDATION: Versed 3 mg.   CONTRAST: 20 mL.   FLUOROSCOPY TIME: Less than 0.5 minutes.   INDICATIONS: [name] is a 64 year old gentleman with advanced pancreatic cancer, who has a history of deep vein thrombosis. Recently, he experienced abrupt onset of shortness of breath and was found to have pulmonary embolism. This occurred while he was taking Lovenox shots on a daily basis. His oncologist has recommended IVC filter placement. Risks and benefits were reviewed with the patient and family. All questions were answered. The patient agrees to proceed.   DESCRIPTION OF PROCEDURE: The patient is taken to special procedures and placed in the supine position. After adequate sedation is achieved, both groins are prepped and draped in sterile fashion. Ultrasound is placed in a sterile sleeve. Ultrasound is utilized secondary to lack of appropriate landmarks and to avoid vascular injury. The common femoral vein is noted to be echolucent and compressible, indicating patency. Image is recorded for the permanent record.   The vein is then accessed using a Seldinger needle under direct ultrasound visualization, J-wire is advanced, followed by the dilator delivery sheath combination. The sheath is positioned at the iliac confluence, and AP projection of the inferior vena cava is obtained. After review of the images,  the Meridian filter is advanced to the level of L3 and deployed without difficulty. Orientation is excellent. The sheath is pulled, pressure is held and a Safeguard is applied. There are no immediate complications.   INTERPRETATION: The inferior vena cava is opacified with a bolus injection of contrast. There are no filling defects noted. Cava measures 20 mm in diameter, and the right and left renal blushes are noted at the L2 level.   SUMMARY: Successful placement of infrarenal inferior vena caval filter, Meridian type.   ____________________________ Renford DillsGregory G. Tashonda Pinkus, MD ggs:lb D: 04/30/2013 08:09:00 ET T: 04/30/2013 08:25:06 ET JOB#: [id number]  cc: Renford DillsGregory G. Lizzie Cokley, MD, <Dictator> Gerome SamJanak K. Doylene Canninghoksi, MD Renford DillsGREGORY G Vernee Baines MD ELECTRONICALLY SIGNED 05/18/2013 17:17

## 2024-02-10 DEATH — deceased
